# Patient Record
Sex: Male | Born: 1945 | Race: White | Hispanic: No | Smoking: Former smoker
Health system: Southern US, Community
[De-identification: ages and names within clinical notes are randomized; demographics above are authoritative.]

## PROBLEM LIST (undated history)

## (undated) DIAGNOSIS — Z973 Presence of spectacles and contact lenses: Secondary | ICD-10-CM

## (undated) DIAGNOSIS — Z85820 Personal history of malignant melanoma of skin: Secondary | ICD-10-CM

## (undated) DIAGNOSIS — N433 Hydrocele, unspecified: Secondary | ICD-10-CM

## (undated) DIAGNOSIS — K219 Gastro-esophageal reflux disease without esophagitis: Secondary | ICD-10-CM

## (undated) DIAGNOSIS — M1990 Unspecified osteoarthritis, unspecified site: Secondary | ICD-10-CM

## (undated) DIAGNOSIS — N5089 Other specified disorders of the male genital organs: Secondary | ICD-10-CM

## (undated) DIAGNOSIS — I10 Essential (primary) hypertension: Secondary | ICD-10-CM

## (undated) DIAGNOSIS — G4733 Obstructive sleep apnea (adult) (pediatric): Secondary | ICD-10-CM

## (undated) DIAGNOSIS — Z8719 Personal history of other diseases of the digestive system: Secondary | ICD-10-CM

## (undated) DIAGNOSIS — R32 Unspecified urinary incontinence: Secondary | ICD-10-CM

## (undated) DIAGNOSIS — F419 Anxiety disorder, unspecified: Secondary | ICD-10-CM

## (undated) DIAGNOSIS — H409 Unspecified glaucoma: Secondary | ICD-10-CM

## (undated) DIAGNOSIS — G4730 Sleep apnea, unspecified: Secondary | ICD-10-CM

## (undated) DIAGNOSIS — C61 Malignant neoplasm of prostate: Secondary | ICD-10-CM

## (undated) DIAGNOSIS — R112 Nausea with vomiting, unspecified: Secondary | ICD-10-CM

## (undated) DIAGNOSIS — Z8546 Personal history of malignant neoplasm of prostate: Secondary | ICD-10-CM

## (undated) DIAGNOSIS — E785 Hyperlipidemia, unspecified: Secondary | ICD-10-CM

## (undated) DIAGNOSIS — Z8679 Personal history of other diseases of the circulatory system: Secondary | ICD-10-CM

## (undated) DIAGNOSIS — Z9889 Other specified postprocedural states: Secondary | ICD-10-CM

## (undated) HISTORY — PX: VASECTOMY: SHX75

---

## 1998-05-26 ENCOUNTER — Ambulatory Visit (HOSPITAL_COMMUNITY): Admission: RE | Admit: 1998-05-26 | Discharge: 1998-05-26 | Payer: Self-pay | Admitting: Gastroenterology

## 2009-12-01 ENCOUNTER — Observation Stay (HOSPITAL_COMMUNITY): Admission: EM | Admit: 2009-12-01 | Discharge: 2009-12-01 | Payer: Self-pay | Admitting: Emergency Medicine

## 2009-12-01 ENCOUNTER — Encounter: Payer: Self-pay | Admitting: Cardiology

## 2009-12-01 ENCOUNTER — Ambulatory Visit: Payer: Self-pay | Admitting: Cardiology

## 2009-12-05 ENCOUNTER — Telehealth (INDEPENDENT_AMBULATORY_CARE_PROVIDER_SITE_OTHER): Payer: Self-pay | Admitting: *Deleted

## 2009-12-06 ENCOUNTER — Encounter (HOSPITAL_COMMUNITY): Admission: RE | Admit: 2009-12-06 | Discharge: 2010-02-23 | Payer: Self-pay | Admitting: Cardiology

## 2009-12-06 ENCOUNTER — Ambulatory Visit: Payer: Self-pay

## 2009-12-06 ENCOUNTER — Ambulatory Visit: Payer: Self-pay | Admitting: Cardiology

## 2009-12-15 DIAGNOSIS — E669 Obesity, unspecified: Secondary | ICD-10-CM

## 2009-12-15 DIAGNOSIS — I10 Essential (primary) hypertension: Secondary | ICD-10-CM | POA: Insufficient documentation

## 2009-12-15 DIAGNOSIS — I471 Supraventricular tachycardia: Secondary | ICD-10-CM

## 2009-12-15 DIAGNOSIS — R002 Palpitations: Secondary | ICD-10-CM

## 2009-12-19 ENCOUNTER — Ambulatory Visit: Payer: Self-pay | Admitting: Cardiology

## 2009-12-19 DIAGNOSIS — R9430 Abnormal result of cardiovascular function study, unspecified: Secondary | ICD-10-CM | POA: Insufficient documentation

## 2009-12-19 DIAGNOSIS — R079 Chest pain, unspecified: Secondary | ICD-10-CM

## 2009-12-20 ENCOUNTER — Encounter: Payer: Self-pay | Admitting: Cardiology

## 2009-12-22 ENCOUNTER — Ambulatory Visit: Payer: Self-pay | Admitting: Cardiology

## 2009-12-22 ENCOUNTER — Inpatient Hospital Stay (HOSPITAL_BASED_OUTPATIENT_CLINIC_OR_DEPARTMENT_OTHER): Admission: RE | Admit: 2009-12-22 | Discharge: 2009-12-22 | Payer: Self-pay | Admitting: Cardiology

## 2010-01-05 ENCOUNTER — Ambulatory Visit: Payer: Self-pay | Admitting: Cardiology

## 2010-12-16 LAB — CONVERTED CEMR LAB
BUN: 16 mg/dL (ref 6–23)
Basophils Absolute: 0 10*3/uL (ref 0.0–0.1)
Basophils Relative: 0.2 % (ref 0.0–3.0)
CO2: 30 meq/L (ref 19–32)
Calcium: 9.1 mg/dL (ref 8.4–10.5)
Chloride: 106 meq/L (ref 96–112)
Eosinophils Absolute: 0.3 10*3/uL (ref 0.0–0.7)
Eosinophils Relative: 3.4 % (ref 0.0–5.0)
Glucose, Bld: 96 mg/dL (ref 70–99)
INR: 1 (ref 0.8–1.0)
Lymphocytes Relative: 16.7 % (ref 12.0–46.0)
Lymphs Abs: 1.4 10*3/uL (ref 0.7–4.0)
MCHC: 33.9 g/dL (ref 30.0–36.0)
Monocytes Relative: 9.6 % (ref 3.0–12.0)
Neutro Abs: 5.9 10*3/uL (ref 1.4–7.7)
Neutrophils Relative %: 70.1 % (ref 43.0–77.0)
Potassium: 4.3 meq/L (ref 3.5–5.1)
Sodium: 141 meq/L (ref 135–145)

## 2010-12-17 IMAGING — CR DG CHEST 1V
2 series · 2 of 2 positions shown · non-contrast
Comparison: None

CLINICAL DATA: Chest pain.

CHEST - 1 VIEW

[w chest pa]
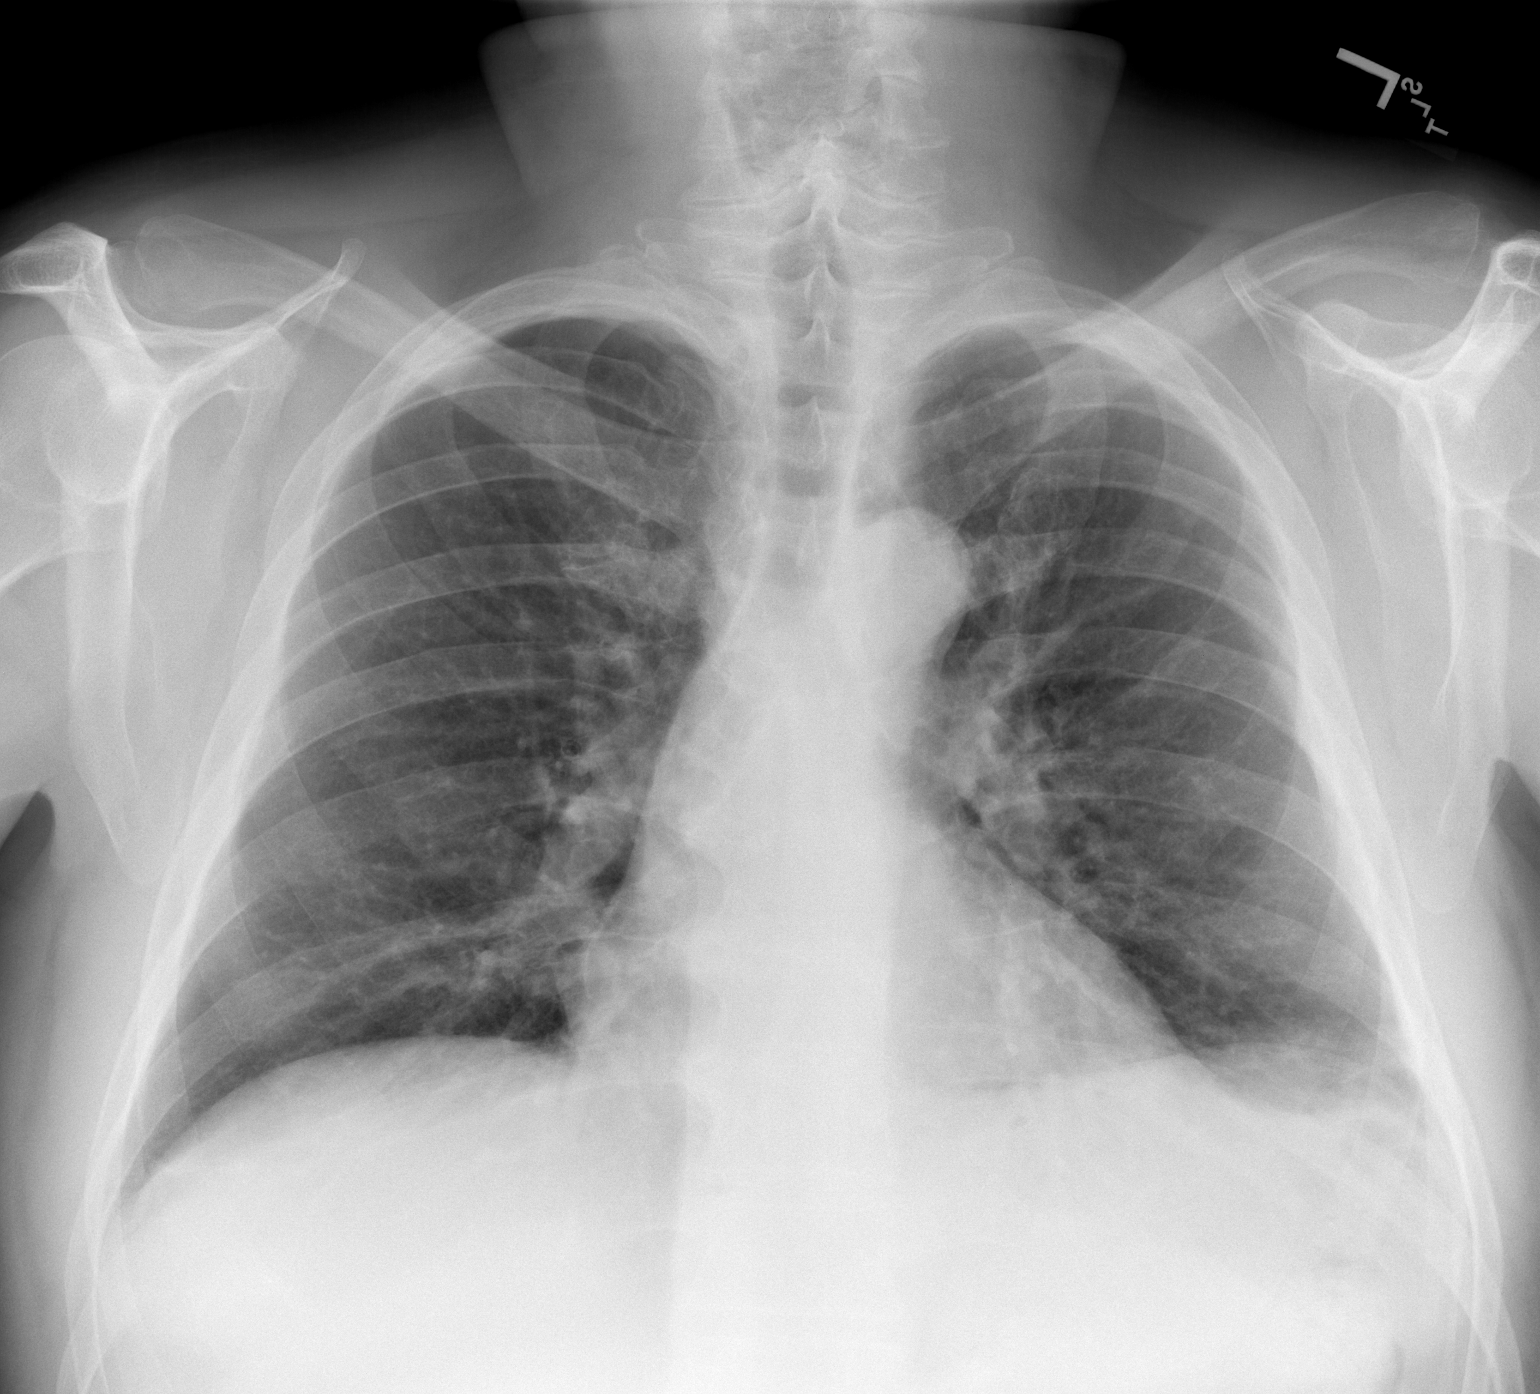

[w chest lat]
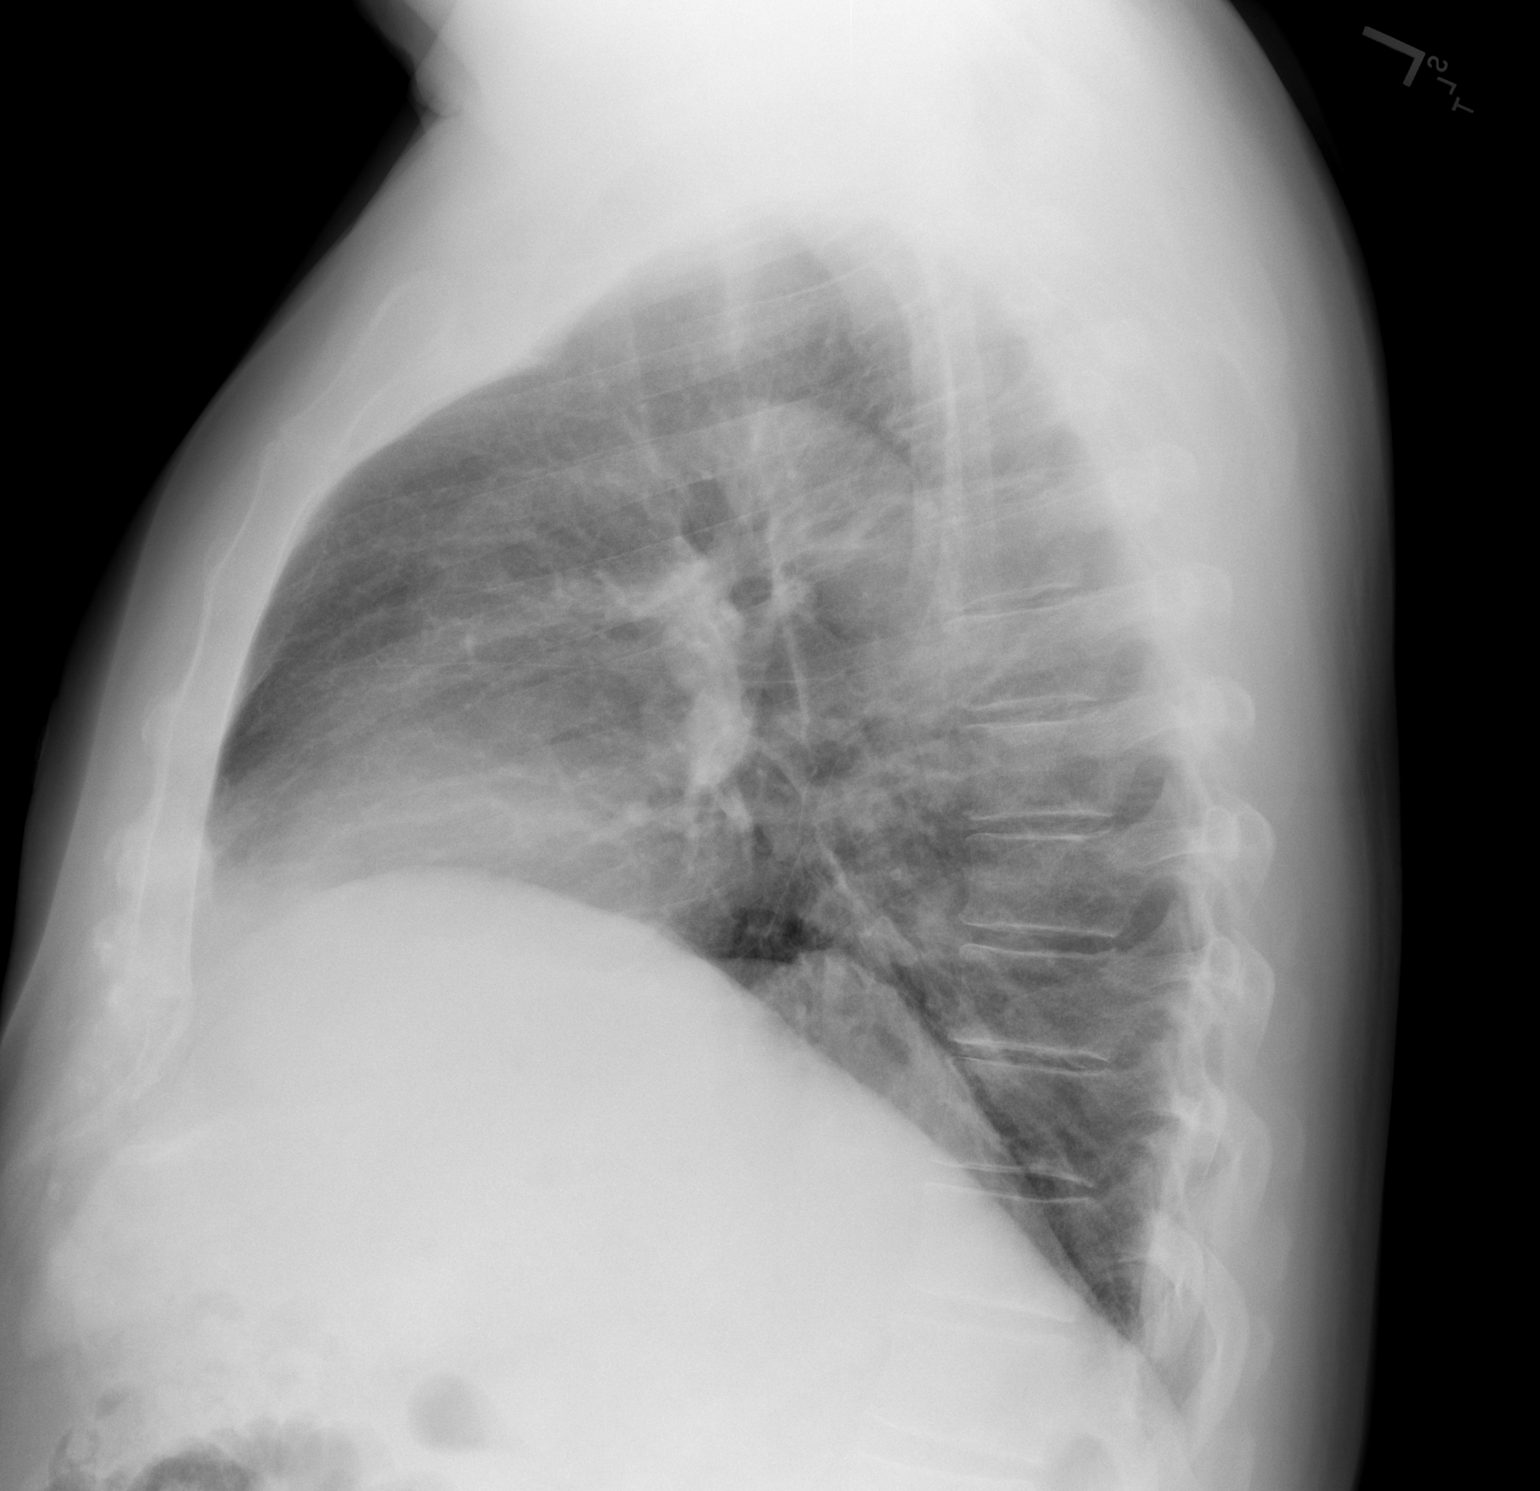

[2 of 2 positions shown; findings below may reference images not displayed]

FINDINGS: There is mild peribronchial thickening.  Bibasilar
atelectasis present.  No effusions.  Heart is upper limits normal
in size.
IMPRESSION: Bronchitic changes.  Bibasilar atelectasis.

## 2010-12-20 NOTE — Assessment & Plan Note (Signed)
Summary: EPH/ F/U ON STRESS TEST, HOLTER MONITOR/ GD   Visit Type:  Follow-up Primary Provider:  Dr Merlene Laughter  CC:  holter monitor-stress test follow up.  History of Present Illness: 65 yo with history of HTN and SVT presents to establish cardiology followup.  In early January, patient developed the sensation of his heart racing while he was sitting on the sofa watching a [employer] basketball game.  He had just had a heated discussion over the phone with his son.  He developed chest pressure.  As the symptoms did not resolve, he called EMS and was taken to the ER.  Heart rate was back to normal in the ER.  He was noted to have mildly elevated cardiac enzymes.  In the hospital, telemetry captured an asymptomatic run of long R-P tachycardia (atrial tachycardia vs AVNRT).  He was started on a beta blocker and discharged home.  He has been wearing a 3 week event monitor which so far has shown nothing significant.  He had an ETT-myoview given the chest pain and mildly elevated cardiac enzymes, which showed inferior ischemia.    He is back at work.  He has had no further chest pressure. He is active at work, going up and down ladders, etc.  No exertional dyspnea.    ECG: NSR, LAFB  Labs (1/11): TSH normal, CKMB 12.2=>9.6, TnI 0.07=>0.09, LDL 121, HDL 35  Current Medications (verified): 1)  Metoprolol Succinate 25 Mg Xr24h-Tab (Metoprolol Succinate) .... Take One Tablet By Mouth Daily 2)  Prinzide 20-12.5 Mg Tabs (Lisinopril-Hydrochlorothiazide) .[name].. 1 Tab Once Daily (Pt Will Start Combination Drug After He Finishes The Indiuvial Lisinopril and Hctz) (Hold) 3)  Aspirin 81 Mg Tbec (Aspirin) .... Take One Tablet By Mouth Daily 4)  Finasteride 5 Mg Tabs (Finasteride) .[name].. 1 Tab Once Daily 5)  Hydrochlorothiazide 12.5 Mg Tabs (Hydrochlorothiazide) .... Take One Tablet By Mouth Daily. 6)  Lisinopril 20 Mg Tabs (Lisinopril) .... Take One Tablet By Mouth Daily  Allergies (verified): No Known Drug  Allergies  Past History:  Past Medical History: 1. SVT: Admitted 1/11 after episode of racing heart rate and chest pain.  HR back to normal by the time he reached the ER but telemetry overnight captured a run of long R-P SVT, either atrial tachycardia or atypical AVNRT.   2. CAD: Elevated cardiac enzymes (mildly) when admitted with racing heart rate/SVT.  ETT-myoview (1/11) showed EF 56%, mild to moderate inferior ischemia.  Patient exercised 7'30", no chest pain.  3.  HTN  Family History: Father had MI at age 21.  Mother with CVA.    Social History: Lives in [place], married with a son.  Company secretary.  Never smoked.  Occasional ETOH.   Review of Systems       All systems reviewed and negative except as per HPI.   Vital Signs:  Patient profile:   65 year old male Height:      73 inches Weight:      239 pounds BMI:     31.65 Pulse rate:   71 / minute BP sitting:   118 / 77  (left arm) Cuff size:   regular  Vitals Entered By: Burnett Kanaris, CNA (December 19, 2009 11:26 AM)  Physical Exam  General:  Well developed, well nourished, in no acute distress. Head:  normocephalic and atraumatic Nose:  no deformity, discharge, inflammation, or lesions Mouth:  Teeth, gums and palate normal. Oral mucosa normal. Neck:  Neck supple, no JVD. No  masses, thyromegaly or abnormal cervical nodes. Lungs:  Clear bilaterally to auscultation and percussion. Heart:  Non-displaced PMI, chest non-tender; regular rate and rhythm, S1, S2 without murmurs, rubs or gallops. Carotid upstroke normal, no bruit. Pedals normal pulses. No edema, no varicosities. Abdomen:  Bowel sounds positive; abdomen soft and non-tender without masses, organomegaly, or hernias noted. No hepatosplenomegaly. Msk:  Back normal, normal gait. Muscle strength and tone normal. Extremities:  No clubbing or cyanosis. Neurologic:  Alert and oriented x 3. Skin:  Intact without lesions or rashes. Psych:  Normal  affect.   Impression & Recommendations:  Problem # 1:  CHEST PAIN (ICD-786.50) Patient had chest pain and elevated cardiac enzymes (mildly) in the setting of probable SVT.  This could have been demand ischemia due to tachycardia, but he had an ETT-myoview suggestive of inferior ischemia.  We discussed management options.  He is not symptomatic with his day to day activities, so I told him that medical management would be an option.  However, he does not want to start any additional medications if he does not have to so we have decided to do a left heart catheterization for definitive diagnosis of CAD.  He will continue ASA, ACEI, and beta blocker.    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  While in the hospital, he had a run of long R-P tachycardia that was atrial tachycardia versus atypical AVNRT.  He has had no further palpitations and nothing on his event monitor.  It appears to be suppressed by Toprol XL.  He has also stopped drinking caffeine which probably helps.    Other Orders: EKG w/ Interpretation (93000) Cardiac Catheterization (Cardiac Cath) TLB-BMP (Basic Metabolic Panel-BMET) (80048-METABOL) TLB-PT (Protime) (85610-PTP) TLB-CBC Platelet - w/Differential (85025-CBCD)  Patient Instructions: 1)  Your physician has requested that you have a cardiac catheterization.  Cardiac catheterization is used to diagnose and/or treat various heart conditions. Doctors may recommend this procedure for a number of different reasons. The most common reason is to evaluate chest pain. Chest pain can be a symptom of coronary artery disease (CAD), and cardiac catheterization can show whether plaque is narrowing or blocking your heart's arteries. This procedure is also used to evaluate the valves, as well as measure the blood flow and oxygen levels in different parts of your heart.  For further information please visit [email or web address]   Please follow instruction sheet, as given. 2)  Your physician recommends that you continue on your current medications as directed. Please refer to the Current Medication list given to you today.

## 2010-12-20 NOTE — Assessment & Plan Note (Signed)
Summary: eph/ gd   Primary Provider:  Dr Merlene Laughter  CC:  eph for flutter.  pt reports heart cath.  No symptoms at this time.  History of Present Illness: 65 yo with history of HTN and SVT presents for cardiology followup.  In early January, patient developed the sensation of his heart racing while he was sitting on the sofa watching a [employer] basketball game.  He had just had a heated discussion over the phone with his son.  He developed chest pressure.  As the symptoms did not resolve, he called EMS and was taken to the ER.  Heart rate was back to normal in the ER.  He was noted to have mildly elevated cardiac enzymes.  In the hospital, telemetry captured an asymptomatic run of long R-P tachycardia (atrial tachycardia vs atypical AVNRT).  He was started on a beta blocker and discharged home.  He had an ETT-myoview given the chest pain and mildly elevated cardiac enzymes, which showed inferior ischemia.  We therefore did a heart catheterization showing no angiographic CAD and normal LV systolic function.  3 week event monitor on Toprol XL showed no runs of SVT.   He is back at work.  He has had no further chest pressure. He is active at work, going up and down ladders, etc.  No exertional dyspnea.  Right groin site has healed well from cath.   ECG: NSR, iRBBB, LAFB  Labs (1/11): TSH normal, CKMB 12.2=>9.6, TnI 0.07=>0.09, LDL 121, HDL 35  Allergies: No Known Drug Allergies  Past History:  Past Medical History: 1. SVT: Admitted 1/11 after episode of racing heart rate and chest pain.  HR back to normal by the time he reached the ER but telemetry overnight captured a run of long R-P SVT, either atrial tachycardia or atypical AVNRT.  On Toprol XL, 3 week event monitor showed no arrhythmias.  2. CAD: Elevated cardiac enzymes (mildly) when admitted with racing heart rate/SVT.  ETT-myoview (1/11) showed EF 56%, mild to moderate inferior ischemia.  Patient exercised 7'30", no chest pain.  Left heart  cath (2/11) showed normal coronaries and EF 55%. Myoview was likely a false positive.  3.  HTN  Family History: Reviewed history from 12/19/2009 and no changes required. Father had MI at age 109.  Mother with CVA.    Social History: Reviewed history from 12/19/2009 and no changes required. 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  Occasional ETOH.   Vital Signs:  Patient profile:   65 year old male Height:      73 inches Weight:      238 pounds BMI:     31.51 Pulse rate:   97 / minute Pulse rhythm:   regular BP sitting:   118 / 84  (left arm) Cuff size:   large  Vitals Entered By: Judithe Modest CMA (January 05, 2010 3:42 PM)  Physical Exam  General:  Well developed, well nourished, in no acute distress. Neck:  Neck supple, no JVD. No masses, thyromegaly or abnormal cervical nodes. Lungs:  Clear bilaterally to auscultation and percussion. Heart:  Non-displaced PMI, chest non-tender; regular rate and rhythm, S1, S2 without murmurs, rubs or gallops. Carotid upstroke normal, no bruit. Pedals normal pulses. No edema, no varicosities. Abdomen:  Bowel sounds positive; abdomen soft and non-tender without masses, organomegaly, or hernias noted. No hepatosplenomegaly. Extremities:  No clubbing or cyanosis. Neurologic:  Alert and oriented x 3. Psych:  Normal affect.  Impression & Recommendations:  Problem # 1:  CARDIOVASCULAR FUNCTION STUDY, ABNORMAL (ICD-794.30) Left heart cath showed EF 55% and no angiographic coronary disease.  Myoview was likely a false positive from diaphragmatic attenuation.  The elevation in cardiac enzymes during hospitalization was likely due to demand ischemia from tachycardia.   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  No arrhythmic events on 3 week event monitor suggests that Toprol XL is suppressing arrhythmia.  Continue Toprol XL.    Followup in 1 year.   Other Orders: EKG w/  Interpretation (93000)  Patient Instructions: 1)  Your physician wants you to follow-up in: 1 year with Dr Marca Ancona.  You will receive a reminder letter in the mail two months in advance. If you don't receive a letter, please call our office to schedule the follow-up appointment. Prescriptions: METOPROLOL SUCCINATE 25 MG XR24H-TAB (METOPROLOL SUCCINATE) Take one tablet by mouth daily  #30 x 11   Entered by:   Katina Dung, RN, BSN   Authorized by:   Marca Ancona, MD   Signed by:   Katina Dung, RN, BSN on 01/05/2010   Method used:   Electronically to        [facility]  [address] (retail)       [address]       [place], [place]  [address]       Ph: [phone] or [phone]       Fax: [phone]   RxID:   [id number]

## 2010-12-20 NOTE — Progress Notes (Signed)
Summary: Nuclear Pre-Procedure  Phone Note Outgoing Call Call back at [facility] Phone [phone]   Call placed by: Stanton Kidney, EMT-P,  December 05, 2009 1:12 PM Action Taken: Phone Call Completed Summary of Call: Left message with information on Myoview Information Sheet (see scanned document for details).      Nuclear Med Background Indications for Stress Test: Evaluation for Ischemia, Post Hospital  Indications Comments: 12/01/09 [facility]: CP/Palps, (PSVT), MI R/O  History: Echo, GXT  History Comments:  ~20 yrs ago GXT: (-) 12/01/09 Echo: EF=50-55%  Symptoms: Chest Pain, Palpitations    Nuclear Pre-Procedure Cardiac Risk Factors: Family History - CAD, Hypertension

## 2010-12-20 NOTE — Consult Note (Signed)
Summary: [facility]   Imported By: Roderic Ovens 12/11/2009 12:18:52  _____________________________________________________________________  External Attachment:    Type:   Image     Comment:   External Document

## 2010-12-20 NOTE — Letter (Signed)
Summary: Cardiac Catheterization Instructions- [facility]  [facility], Main Office  [address]   [place], [place] [address]   Phone: [phone]  Fax: [phone]     12/19/2009 MRN: [id number]  [facility] [address] [place], [place]  [address]  Dear Mr. Hilgert,   You are scheduled for a Cardiac Catheterization on Friday February 4,2011 with Dr. Marca Ancona.  Please arrive to the 1st floor of the Heart and Vascular Center at [facility] at 10:30 am  on the day of your procedure. Please do not arrive before 6:30 a.m. Call the Heart and Vascular Center at [phone] if you are unable to make your appointmnet. The Code to get into the parking garage under the building is 0900. Take the elevators to the 1st floor. You must have someone to drive you home. Someone must be with you for the first 24 hours after you arrive home. Please wear clothes that are easy to get on and off and wear slip-on shoes. Do not eat or drink after midnight except water with your medications that morning. Bring all your medications and current insurance cards with you.  __x_ DO NOT take these medications before your procedure:   Hold your HCTZ (Hydrochlorothiazide) the morning of the procedure.  _x__ Make sure you take your aspirin.       The usual length of stay after your procedure is 2 to 3 hours. This can vary.  If you have any questions, please call the office at the number listed above.   Katina Dung, RN, BSN

## 2010-12-20 NOTE — Cardiovascular Report (Signed)
Summary: Pre Cath Orders  Pre Cath Orders   Imported By: Roderic Ovens 12/27/2009 12:29:24  _____________________________________________________________________  External Attachment:    Type:   Image     Comment:   External Document

## 2010-12-20 NOTE — Assessment & Plan Note (Signed)
Summary: Cardiology Nuclear Study  Nuclear Med Background Indications for Stress Test: Evaluation for Ischemia, Post Hospital  Indications Comments: 12/01/09 [facility]:chest pain, palpitations (PSVT), MI R/O  History: Echo, GXT  History Comments:  ~20 yrs ago [id number];  12/01/09 Echo: EF=50-55%  Symptoms: Chest Tightness, Nausea, Palpitations, Rapid HR  Symptoms Comments: Last episode of [name]   Nuclear Pre-Procedure Cardiac Risk Factors: Family History - CAD, History of Smoking, Hypertension, Obesity Caffeine/Decaff Intake: None NPO After: 3:00 PM Lungs: Clear IV 0.9% NS with Angio Cath: 22g     IV Site: (R) AC IV Started by: Irean Hong RN Chest Size (in) 44     Height (in): 73 Weight (lb): 239 BMI: 31.65 Tech Comments: Held Toprol x 24 hours  Nuclear Med Study 1 or 2 day study:  1 day     Stress Test Type:  Stress Reading MD:  Willa Rough, MD     Referring MD:  Marca Ancona, MD Resting Radionuclide:  Technetium 80m Tetrofosmin     Resting Radionuclide Dose:  10.0 mCi  Stress Radionuclide:  Technetium 44m Tetrofosmin     Stress Radionuclide Dose:  33.0 mCi   Stress Protocol Exercise Time (min):  7:30 min     Max HR:  141 bpm     Predicted Max HR:  155 bpm  Max Systolic BP: 203 mm Hg     Percent Max HR:  90.97 %     METS: 9.3 Rate Pressure Product:  47829    Stress Test Technologist:  Rea College CMA-N     Nuclear Technologist:  Burna Mortimer Deal RT-N  Rest Procedure  Myocardial perfusion imaging was performed at rest 45 minutes following the intravenous administration of Myoview Technetium 59m Tetrofosmin.  Stress Procedure  The patient exercised for 7:30.  The patient stopped due to fatigue and denied any chest pain.  There were no significant ST-T wave changes, only occasional PVC's and PAC's,  Intermittent RBBB was noted in recovery.  He did have a hypertensive response to exercise 203/89 and 200/97, while off his Toprol for 24 hours. Myoview was  injected at peak exercise and myocardial perfusion imaging was performed after a brief delay.  QPS Raw Data Images:  Patient motion noted; appropriate software correction applied. Stress Images:  Mild decrease in activity in the inferior wall. Rest Images:  Normal homogeneous uptake in all areas of the myocardium. Subtraction (SDS):  Mild reversibility in the inferior wall. Transient Ischemic Dilatation:  1.00  (Normal <1.22)  Lung/Heart Ratio:  .32  (Normal <0.45)  Quantitative Gated Spect Images QGS EDV:  96 ml QGS ESV:  42 ml QGS EF:  56 % QGS cine images:  Normal motion.   Overall Impression  Exercise Capacity: Fair exercise capacity. BP Response: Hypertensive blood pressure response. Clinical Symptoms: No chest pain ECG Impression: No significant ST segment change suggestive of ischemia. Overall Impression Comments: Visually there is very mild reversibility in the inferior wall. The quantitative analysis suggests mild/moderate ischemia in the inferior wall.  Appended Document: Cardiology Nuclear Study Possible mild inferior ischemia.  Needs to followup in the office to talk about this.   Appended Document: Cardiology Nuclear Study Pt aware of results. He has an appt to f/u on 12/19/09 with Dr. [name].

## 2011-02-03 LAB — LIPID PANEL
HDL: 35 mg/dL — ABNORMAL LOW
Total CHOL/HDL Ratio: 5 ratio
Triglycerides: 94 mg/dL
VLDL: 19 mg/dL (ref 0–40)

## 2011-02-03 LAB — POCT CARDIAC MARKERS
CKMB, poc: 4.8 ng/mL (ref 1.0–8.0)
CKMB, poc: 9 ng/mL (ref 1.0–8.0)
Myoglobin, poc: 115 ng/mL (ref 12–200)
Myoglobin, poc: 250 ng/mL (ref 12–200)
Troponin i, poc: <0.05 ng/mL (ref 0.00–0.09)
Troponin i, poc: <0.05 ng/mL (ref 0.00–0.09)

## 2011-02-03 LAB — CARDIAC PANEL(CRET KIN+CKTOT+MB+TROPI)
CK, MB: 12.2 ng/mL (ref 0.3–4.0)
CK, MB: 9.6 ng/mL (ref 0.3–4.0)
Relative Index: 3.4 — ABNORMAL HIGH (ref 0.0–2.5)
Relative Index: 3.5 — ABNORMAL HIGH (ref 0.0–2.5)
Total CK: 276 U/L — ABNORMAL HIGH (ref 7–232)
Total CK: 362 U/L — ABNORMAL HIGH (ref 7–232)

## 2011-02-03 LAB — DIFFERENTIAL
Basophils Absolute: 0.1 10*3/uL (ref 0.0–0.1)
Basophils Relative: 1 % (ref 0–1)
Eosinophils Absolute: 0.3 10*3/uL (ref 0.0–0.7)
Eosinophils Relative: 4 % (ref 0–5)
Lymphocytes Relative: 16 % (ref 12–46)
Monocytes Relative: 12 % (ref 3–12)
Neutrophils Relative %: 67 % (ref 43–77)

## 2011-02-03 LAB — CK TOTAL AND CKMB (NOT AT ARMC)
CK, MB: 16.9 ng/mL (ref 0.3–4.0)
Relative Index: 3 — ABNORMAL HIGH (ref 0.0–2.5)
Total CK: 556 U/L — ABNORMAL HIGH (ref 7–232)

## 2011-02-03 LAB — BASIC METABOLIC PANEL WITH GFR
BUN: 26 mg/dL — ABNORMAL HIGH (ref 6–23)
CO2: 27 meq/L (ref 19–32)
Calcium: 8.5 mg/dL (ref 8.4–10.5)
Chloride: 106 meq/L (ref 96–112)
Creatinine, Ser: 1.2 mg/dL (ref 0.4–1.5)
GFR calc non Af Amer: >60 mL/min
Glucose, Bld: 99 mg/dL (ref 70–99)
Potassium: 3.8 meq/L (ref 3.5–5.1)
Sodium: 140 meq/L (ref 135–145)

## 2011-02-03 LAB — CBC
Hemoglobin: 14.8 g/dL (ref 13.0–17.0)
MCHC: 34.8 g/dL (ref 30.0–36.0)
MCV: 90.5 fL (ref 78.0–100.0)
RBC: 4.69 MIL/uL (ref 4.22–5.81)
WBC: 7.6 10*3/uL (ref 4.0–10.5)

## 2011-02-03 LAB — TROPONIN I: Troponin I: 0.07 ng/mL — ABNORMAL HIGH (ref 0.00–0.06)

## 2011-02-03 LAB — TSH: TSH: 2.478 u[IU]/mL (ref 0.350–4.500)

## 2011-02-03 LAB — APTT: aPTT: 31 s (ref 24–37)

## 2011-02-03 LAB — PROTIME-INR: INR: 0.94 (ref 0.00–1.49)

## 2011-02-03 LAB — D-DIMER, QUANTITATIVE: D-Dimer, Quant: 0.33 ug/mL-FEU (ref 0.00–0.48)

## 2011-10-02 ENCOUNTER — Other Ambulatory Visit: Payer: Self-pay | Admitting: Geriatric Medicine

## 2011-10-02 ENCOUNTER — Ambulatory Visit
Admission: RE | Admit: 2011-10-02 | Discharge: 2011-10-02 | Disposition: A | Discharge status: Home / Self Care | Payer: Medicare Other | Source: Ambulatory Visit | Attending: Geriatric Medicine | Admitting: Geriatric Medicine

## 2011-10-02 MED ORDER — IOHEXOL 300 MG/ML  SOLN
75.0000 mL | Freq: Once | INTRAMUSCULAR | Status: AC | PRN
  Administered 2011-10-02: 75 mL via INTRAVENOUS

## 2014-08-24 HISTORY — PX: PROSTATE BIOPSY: SHX241

## 2014-09-22 ENCOUNTER — Other Ambulatory Visit: Payer: Self-pay | Admitting: Urology

## 2014-09-22 DIAGNOSIS — C61 Malignant neoplasm of prostate: Secondary | ICD-10-CM

## 2014-10-07 ENCOUNTER — Ambulatory Visit (HOSPITAL_COMMUNITY): Payer: Medicare Other

## 2014-10-07 ENCOUNTER — Encounter (HOSPITAL_COMMUNITY): Payer: Medicare Other

## 2014-10-10 ENCOUNTER — Encounter (HOSPITAL_COMMUNITY)
Admission: RE | Admit: 2014-10-10 | Discharge: 2014-10-10 | Disposition: A | Discharge status: Home / Self Care | Payer: Medicare Other | Source: Ambulatory Visit | Attending: Urology | Admitting: Urology

## 2014-10-10 DIAGNOSIS — C61 Malignant neoplasm of prostate: Secondary | ICD-10-CM | POA: Insufficient documentation

## 2014-10-10 MED ORDER — TECHNETIUM TC 99M MEDRONATE IV KIT
26.2000 | PACK | Freq: Once | INTRAVENOUS | Status: AC | PRN
  Administered 2014-10-10: 26.2 via INTRAVENOUS

## 2014-10-11 ENCOUNTER — Encounter: Payer: Self-pay | Admitting: Radiation Oncology

## 2014-10-11 NOTE — Progress Notes (Signed)
GU Location of Tumor / Histology: prostate cancer  If Prostate Cancer, Gleason Score is (3+3=6, 4+4=8) and PSA is (5.18) and prostate volume is 38 mL   Abbott Pao presented with a rising PSA.  Biopsies revealed:   08/24/14   Past/Anticipated interventions by urology, if any: prostate biopsy, surgery versus radiation and 2 years androgen ablation.  Past/Anticipated interventions by medical oncology, if any: no  Weight changes, if any: no  Bowel/Bladder complaints, if any: no IPSS score of 3  Nausea/Vomiting, if any: no  Pain issues, if any:  no  SAFETY ISSUES:  Prior radiation? no  Pacemaker/ICD? no  Possible current pregnancy? no  Is the patient on methotrexate? no  Current Complaints / other details:  Patient is here with his significant other.  He has two children.

## 2014-10-18 ENCOUNTER — Other Ambulatory Visit: Payer: Self-pay | Admitting: Urology

## 2014-10-18 DIAGNOSIS — R948 Abnormal results of function studies of other organs and systems: Secondary | ICD-10-CM

## 2014-10-19 ENCOUNTER — Encounter: Payer: Self-pay | Admitting: Radiation Oncology

## 2014-10-19 ENCOUNTER — Ambulatory Visit
Admission: RE | Admit: 2014-10-19 | Discharge: 2014-10-19 | Disposition: A | Discharge status: Home / Self Care | Payer: Medicare Other | Source: Ambulatory Visit | Attending: Radiation Oncology | Admitting: Radiation Oncology

## 2014-10-19 VITALS — BP 138/89 | HR 67 | Temp 98.2°F | Resp 16 | Ht 72.0 in | Wt 261.1 lb

## 2014-10-19 DIAGNOSIS — C61 Malignant neoplasm of prostate: Secondary | ICD-10-CM

## 2014-10-19 HISTORY — DX: Sleep apnea, unspecified: G47.30

## 2014-10-19 HISTORY — DX: Gastro-esophageal reflux disease without esophagitis: K21.9

## 2014-10-19 HISTORY — DX: Hyperlipidemia, unspecified: E78.5

## 2014-10-19 HISTORY — DX: Malignant neoplasm of prostate: C61

## 2014-10-19 HISTORY — DX: Unspecified glaucoma: H40.9

## 2014-10-19 NOTE — Progress Notes (Signed)
Radiation Oncology         ([phone]) [phone] ________________________________  Initial Outpatient Consultation  Name: Anthony Glenn MRN: [id number]  Date: 10/19/2014  DOB: 1946/09/04  OA:[id number],SWF Marcello Moores, MD  Malka So, MD   REFERRING PHYSICIAN: Malka So, MD  DIAGNOSIS: stage TIc Gleason's 8 (4+4)  prostate cancer, PSA 5.18  HISTORY OF PRESENT ILLNESS::Anthony Glenn is a 68 y.o. male who is seen out courtesy of Dr. Irine Seal for an opinion concerning radiation therapy as part of management of patient's recently diagnosed prostate cancer. The patient has been undergoing yearly PSAs. The patient's most recent PSA was elevated at 5.18. Patient was referred to Dr. Jeffie Pollock for evaluation. no palpable nodules within the prostate gland. This proceeded to undergo transrectal ultrasound and biopsy. The prostate volume was 39.7 cm. There were no lesions noted on ultrasound. 4/12 biopsies revealed adenocarcinoma with the most significant involvement in the left lateral mid prostate gland showing 30% involvement of one core with a Gleason score of 8.  The patient underwent a bone scan showing no obvious osseous metastasis. The patient's CT scan of abdomen and pelvis is pending at this time. Assuming no evidence of metastasis on CT scan patient felt to be a good candidate for radical prostatectomy given his age and excellent health. Radiation therapy is been consulted for further discussion of the patient's options concerning management.[name]  PREVIOUS RADIATION THERAPY: No  PAST MEDICAL HISTORY:  has a past medical history of Prostate cancer; GERD (gastroesophageal reflux disease); Glaucoma; Hyperlipidemia; and Sleep apnea.    PAST SURGICAL HISTORY: Past Surgical History  Procedure Laterality Date  . Prostate biopsy  08/24/14  . Vasectomy      FAMILY HISTORY: family history includes Cancer in his father. prostate cancer at age approximately 68  SOCIAL HISTORY:  reports that he quit smoking  about 45 years ago. His smoking use included Cigarettes. He has a 1.5 pack-year smoking history. He has never used smokeless tobacco. He reports that he uses illicit drugs about twice per week. He reports that he does not drink alcohol.  ALLERGIES: Review of patient's allergies indicates no known allergies.  MEDICATIONS:  Current Outpatient Prescriptions  Medication Sig Dispense Refill  . latanoprost (XALATAN) 0.005 % ophthalmic solution 1 drop at bedtime.    [name] lisinopril-hydrochlorothiazide (PRINZIDE,ZESTORETIC) 20-12.5 MG per tablet Take 1 tablet by mouth daily.    . metoprolol succinate (TOPROL-XL) 50 MG 24 hr tablet Take 50 mg by mouth daily. Take with or immediately following a meal.    . timolol (TIMOPTIC) 0.5 % ophthalmic solution 1 drop 2 (two) times daily.     No current facility-administered medications for this encounter.    REVIEW OF SYSTEMS:  A 15 point review of systems is documented in the electronic medical record. This was obtained by the nursing staff. However, I reviewed this with the patient to discuss relevant findings and make appropriate changes.  The patient completed the international prostate symptom score showing total score of 3 representing minimal symptomatology.  the patient completed the SHIM Questionnaire with total score of 11 representing moderate erectile dysfunction.   PHYSICAL EXAM:  height is 6' (1.829 m) and weight is 261 lb 1.6 oz (118.434 kg). His oral temperature is 98.2 F (36.8 C). His blood pressure is 138/89 and his pulse is 67. His respiration is 16.   BP 138/89 mmHg  Pulse 67  Temp(Src) 98.2 F (36.8 C) (Oral)  Resp 16  Ht 6' (1.829 m)  Wt 261 lb 1.6 oz (118.434 kg)  BMI 35.40 kg/m2  General Appearance:    Alert, cooperative, no distress, appears stated age  Head:    Normocephalic, without obvious abnormality, atraumatic  Eyes:    PERRL, conjunctiva/corneas clear, EOM's intact,         Nose:   Nares normal, septum midline, mucosa  normal, no drainage    or sinus tenderness  Throat:   Lips, mucosa, and tongue normal; teeth and gums normal  Neck:   Supple, symmetrical, trachea midline, no adenopathy;       thyroid:  No enlargement/tenderness/nodules; no carotid   bruit or JVD  Back:     Symmetric, no curvature, ROM normal, no CVA tenderness  Lungs:     Clear to auscultation bilaterally, respirations unlabored  Chest wall:    No tenderness or deformity  Heart:    Regular rate and rhythm, S1 and S2 normal, no murmur, rub   or gallop  Abdomen:     Soft, non-tender, bowel sounds active all four quadrants,    no masses, no organomegaly  Genitalia:    Normal male without lesion, discharge or tenderness,  Uncircumcised, fluid noted in the scrotal sac  Rectal:    Normal tone, normal prostate, no masses or tenderness;    Extremities:   Extremities normal, atraumatic, no cyanosis or edema  Pulses:   2+ and symmetric all extremities  Skin:   Skin color, texture, turgor normal, no rashes or lesions  Lymph nodes:   Cervical, supraclavicular, and axillary nodes normal  Neurologic:    Normal strength, sensation and reflexes      throughout     ECOG = 0  0 - Asymptomatic (Fully active, able to carry on all predisease activities without restriction)  LABORATORY DATA:  Lab Results  Component Value Date   WBC 8.4 12/19/2009   HGB 15.5 12/19/2009   HCT 45.8 12/19/2009   MCV 91.3 12/19/2009   PLT 312.0 12/19/2009   NEUTROABS 5.9 12/19/2009   Lab Results  Component Value Date   NA 141 12/19/2009   K 4.3 12/19/2009   CL 106 12/19/2009   CO2 30 12/19/2009   GLUCOSE 96 12/19/2009   CREATININE 1.0 12/19/2009   CALCIUM 9.1 12/19/2009      RADIOGRAPHY: Nm Bone Scan Whole Body  10/10/2014   CLINICAL DATA:  Prostate cancer.  EXAM: NUCLEAR MEDICINE WHOLE BODY BONE SCAN  TECHNIQUE: Whole body anterior and posterior images were obtained approximately 3 hours after intravenous injection of radiopharmaceutical.   RADIOPHARMACEUTICALS:  26.2 mCi Technetium-99 MDP  COMPARISON:  No prior.  FINDINGS: Bilateral renal function and excretion. Mild increased activity noted over both shoulders, the medial joint spaces of both knees, and right foot consistent with degenerative change. Mild increased activity noted over the ethmoidal and right maxillary regions, these changes may be related to sinus disease. Mild increase activity noted over the right cervical thoracic vertebral region. Mild increased activity also noted over the mid and lower lumbar spine. Metastatic disease cannot be excluded. MRI lumbar spine with gadolinium enhancement suggested.  IMPRESSION: 1. Mild increased activity noted over the right cervicothoracic spine region (C7/T1 region). Mild focal areas of increased activity noted over the left mid and lower lumbar spine. Metastatic disease cannot be excluded. Gadolinium-enhanced MRI lumbar spine should be considered.  2. Mild increase activity noted over the ethmoidal regions and right maxillary region. These changes may be related to sinus disease. Sinus series can be obtained for further evaluation.  3. Mild increased activity noted both shoulders, the medial joint spaces of both knees, and right foot consistent with degenerative change.   Electronically Signed   By: Marcello Moores  Register   On: 10/10/2014 13:32      IMPRESSION: stage TIc Gleason's 8 (4+4)  prostate cancer, PSA 5.18.  I discussed with the patient and his friend that his prognosis relates to his presenting PSA, Gleason score and clinical stage. The patient's PSA and clinical stage are favorable however his Gleason score is worrisome. With his Gleason score of 8 and relatively young age I would not recommend watchful waiting in this situation.  Patient appears in excellent health and he would be at candidate for radical prostatectomy. I discussed this treatment approach as it compares to external beam radiation therapy and combination therapy of 5 weeks  of external beam and radioactive seed boost. I would recommend androgen ablation as part of his overall management if he chooses to proceed with radiation therapy given his Gleason score of 8. At This time the patient seems to be leaning toward surgery for definitive management.  PLAN:the patient will proceed with his CT scan of abdomen and pelvis and followup with Dr. Jeffie Pollock. I've asked patient to call me if he chooses to proceed with radiation therapy for definitive management.   I spent 60 minutes minutes face to face with the patient and more than 50% of that time was spent in counseling and/or coordination of care.   ------------------------------------------------  Blair Promise, PhD, MD

## 2014-10-19 NOTE — Progress Notes (Signed)
Please see the Nurse Progress Note in the MD Initial Consult Encounter for this patient. 

## 2014-11-02 ENCOUNTER — Ambulatory Visit (HOSPITAL_COMMUNITY)
Admission: RE | Admit: 2014-11-02 | Discharge: 2014-11-02 | Disposition: A | Discharge status: Home / Self Care | Payer: Medicare Other | Source: Ambulatory Visit | Attending: Urology | Admitting: Urology

## 2014-11-02 DIAGNOSIS — C61 Malignant neoplasm of prostate: Secondary | ICD-10-CM | POA: Diagnosis not present

## 2014-11-02 DIAGNOSIS — M5136 Other intervertebral disc degeneration, lumbar region: Secondary | ICD-10-CM | POA: Insufficient documentation

## 2014-11-02 DIAGNOSIS — R972 Elevated prostate specific antigen [PSA]: Secondary | ICD-10-CM | POA: Diagnosis not present

## 2014-11-02 DIAGNOSIS — M5137 Other intervertebral disc degeneration, lumbosacral region: Secondary | ICD-10-CM | POA: Insufficient documentation

## 2014-11-02 DIAGNOSIS — R948 Abnormal results of function studies of other organs and systems: Secondary | ICD-10-CM | POA: Diagnosis not present

## 2014-11-02 LAB — POCT I-STAT CREATININE: Creatinine, Ser: 1.3 mg/dL (ref 0.50–1.35)

## 2014-11-02 MED ORDER — GADOBENATE DIMEGLUMINE 529 MG/ML IV SOLN
20.0000 mL | Freq: Once | INTRAVENOUS | Status: AC | PRN
  Administered 2014-11-02: 20 mL via INTRAVENOUS

## 2014-11-25 ENCOUNTER — Other Ambulatory Visit: Payer: Self-pay | Admitting: Urology

## 2014-12-07 NOTE — Patient Instructions (Addendum)
Anthony Glenn  12/07/2014   Your procedure is scheduled on: 12/21/2014    Report to [facility] Main  Entrance and follow signs to               [place] at       [place].  Call this number if you have problems the morning of surgery [phone]   Remember:  Do not eat food or drink liquids :After Midnight.     Take these medicines the morning of surgery with A SIP OF WATER: Toprol, Timolol eye drops, Prilosec                                You may not have any metal on your body including hair pins and              piercings  Do not wear jewelry, , lotions, powders or perfumes.                       Men may shave face and neck.   Do not bring valuables to the hospital. [place].  Contacts, dentures or bridgework may not be worn into surgery.  Leave suitcase in the car. After surgery it may be brought to your room.       Special Instructions: coughing and deep breathing exercises, leg exercises               Please read over the following fact sheets you were given: _____________________________________________________________________              WHAT IS A BLOOD TRANSFUSION? Blood Transfusion Information  A transfusion is the replacement of blood or some of its parts. Blood is made up of multiple cells which provide different functions.  Red blood cells carry oxygen and are used for blood loss replacement.  White blood cells fight against infection.  Platelets control bleeding.  Plasma helps clot blood.  Other blood products are available for specialized needs, such as hemophilia or other clotting disorders. BEFORE THE TRANSFUSION  Who gives blood for transfusions?   Healthy volunteers who are fully evaluated to make sure their blood is safe. This is blood bank blood. Transfusion therapy is the safest it has ever been in the practice of medicine. Before blood is taken from a donor,  a complete history is taken to make sure that person has no history of diseases nor engages in risky social behavior (examples are intravenous drug use or sexual activity with multiple partners). The donor's travel history is screened to minimize risk of transmitting infections, such as malaria. The donated blood is tested for signs of infectious diseases, such as HIV and hepatitis. The blood is then tested to be sure it is compatible with you in order to minimize the chance of a transfusion reaction. If you or a relative donates blood, this is often done in anticipation of surgery and is not appropriate for emergency situations. It takes many days to process the donated blood. RISKS AND COMPLICATIONS Although transfusion therapy is very safe and saves many lives, the main dangers of transfusion include:   Getting an infectious disease.  Developing a transfusion reaction. This is an allergic reaction to something in  the blood you were given. Every precaution is taken to prevent this. The decision to have a blood transfusion has been considered carefully by your caregiver before blood is given. Blood is not given unless the benefits outweigh the risks. AFTER THE TRANSFUSION  Right after receiving a blood transfusion, you will usually feel much better and more energetic. This is especially true if your red blood cells have gotten low (anemic). The transfusion raises the level of the red blood cells which carry oxygen, and this usually causes an energy increase.  The nurse administering the transfusion will monitor you carefully for complications. HOME CARE INSTRUCTIONS  No special instructions are needed after a transfusion. You may find your energy is better. Speak with your caregiver about any limitations on activity for underlying diseases you may have. SEEK MEDICAL CARE IF:   Your condition is not improving after your transfusion.  You develop redness or irritation at the intravenous (IV)  site. SEEK IMMEDIATE MEDICAL CARE IF:  Any of the following symptoms occur over the next 12 hours:  Shaking chills.  You have a temperature by mouth above 102 F (38.9 C), not controlled by medicine.  Chest, back, or muscle pain.  People around you feel you are not acting correctly or are confused.  Shortness of breath or difficulty breathing.  Dizziness and fainting.  You get a rash or develop hives.  You have a decrease in urine output.  Your urine turns a dark color or changes to pink, red, or brown. Any of the following symptoms occur over the next 10 days:  You have a temperature by mouth above 102 F (38.9 C), not controlled by medicine.  Shortness of breath.  Weakness after normal activity.  The white part of the eye turns yellow (jaundice).  You have a decrease in the amount of urine or are urinating less often.  Your urine turns a dark color or changes to pink, red, or brown. Document Released: 11/01/2000 Document Revised: 01/27/2012 Document Reviewed: 06/20/2008 ExitCare Patient Information 2014 [employer].  _______________________________________________________________________[facility] - Preparing for Surgery Before surgery, you can play an important role.  Because skin is not sterile, your skin needs to be as free of germs as possible.  You can reduce the number of germs on your skin by washing with CHG (chlorahexidine gluconate) soap before surgery.  CHG is an antiseptic cleaner which kills germs and bonds with the skin to continue killing germs even after washing. Please DO NOT use if you have an allergy to CHG or antibacterial soaps.  If your skin becomes reddened/irritated stop using the CHG and inform your nurse when you arrive at Short Stay. Do not shave (including legs and underarms) for at least 48 hours prior to the first CHG shower.  You may shave your face/neck. Please follow these instructions carefully:  1.  Shower with CHG Soap the night  before surgery and the  morning of Surgery.  2.  If you choose to wash your hair, wash your hair first as usual with your  normal  shampoo.  3.  After you shampoo, rinse your hair and body thoroughly to remove the  shampoo.                           4.  Use CHG as you would any other liquid soap.  You can apply chg directly  to the skin and wash  Gently with a scrungie or clean washcloth.  5.  Apply the CHG Soap to your body ONLY FROM THE NECK DOWN.   Do not use on face/ open                           Wound or open sores. Avoid contact with eyes, ears mouth and genitals (private parts).                       Wash face,  Genitals (private parts) with your normal soap.             6.  Wash thoroughly, paying special attention to the area where your surgery  will be performed.  7.  Thoroughly rinse your body with warm water from the neck down.  8.  DO NOT shower/wash with your normal soap after using and rinsing off  the CHG Soap.                9.  Pat yourself dry with a clean towel.            10.  Wear clean pajamas.            11.  Place clean sheets on your bed the night of your first shower and do not  sleep with pets. Day of Surgery : Do not apply any lotions/deodorants the morning of surgery.  Please wear clean clothes to the hospital/surgery center.  FAILURE TO FOLLOW THESE INSTRUCTIONS MAY RESULT IN THE CANCELLATION OF YOUR SURGERY PATIENT SIGNATURE_________________________________  NURSE SIGNATURE__________________________________  ________________________________________________________________________

## 2014-12-08 ENCOUNTER — Ambulatory Visit (HOSPITAL_COMMUNITY)
Admission: RE | Admit: 2014-12-08 | Discharge: 2014-12-08 | Disposition: A | Discharge status: Home / Self Care | Payer: Medicare Other | Source: Ambulatory Visit | Attending: Urology | Admitting: Urology

## 2014-12-08 ENCOUNTER — Encounter (HOSPITAL_COMMUNITY)
Admission: RE | Admit: 2014-12-08 | Discharge: 2014-12-08 | Disposition: A | Discharge status: Home / Self Care | Payer: Medicare Other | Source: Ambulatory Visit | Attending: Urology | Admitting: Urology

## 2014-12-08 ENCOUNTER — Encounter (HOSPITAL_COMMUNITY): Payer: Self-pay

## 2014-12-08 DIAGNOSIS — Z01818 Encounter for other preprocedural examination: Secondary | ICD-10-CM

## 2014-12-08 HISTORY — DX: Essential (primary) hypertension: I10

## 2014-12-08 HISTORY — DX: Unspecified osteoarthritis, unspecified site: M19.90

## 2014-12-08 LAB — CBC
HCT: 46.5 % (ref 39.0–52.0)
Hemoglobin: 15.6 g/dL (ref 13.0–17.0)
MCH: 30.6 pg (ref 26.0–34.0)
MCHC: 33.5 g/dL (ref 30.0–36.0)
MCV: 91.2 fL (ref 78.0–100.0)
Platelets: 283 10*3/uL (ref 150–400)
RBC: 5.1 MIL/uL (ref 4.22–5.81)
RDW: 12.8 % (ref 11.5–15.5)
WBC: 8.3 10*3/uL (ref 4.0–10.5)

## 2014-12-08 LAB — BASIC METABOLIC PANEL
Anion gap: 5 (ref 5–15)
BUN: 22 mg/dL (ref 6–23)
CHLORIDE: 102 meq/L (ref 96–112)
CO2: 27 mmol/L (ref 19–32)
Calcium: 8.9 mg/dL (ref 8.4–10.5)
Creatinine, Ser: 1.07 mg/dL (ref 0.50–1.35)
GFR calc non Af Amer: 68 mL/min — ABNORMAL LOW (ref >90)
GFR, EST AFRICAN AMERICAN: 79 mL/min — AB (ref >90)
Glucose, Bld: 107 mg/dL — ABNORMAL HIGH (ref 70–99)
Potassium: 4.4 mmol/L (ref 3.5–5.1)
SODIUM: 134 mmol/L — AB (ref 135–145)

## 2014-12-08 NOTE — Progress Notes (Addendum)
EKG- 05/09/2014 on chart  Sleep Study - 05/2013 on chart   LOV with Cardiology - Dr Aundra Dubin- 01/05/2010- had Stress Test in 2011 due to SVT presentation in ER 11/2009 and cath which per office visit note in EPIC showed no angiographic CAD.

## 2014-12-09 ENCOUNTER — Other Ambulatory Visit (HOSPITAL_COMMUNITY): Payer: Self-pay

## 2014-12-20 NOTE — H&P (Signed)
Active Problems Problems  1. Elevated prostate specific antigen (PSA) (R97.2) 2. 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 Prostate cancer (C61)  History of Present Illness Anthony Glenn returns today for a preop visit for a RALP with BPLND on 12/21/14. He was biopsied for an elevated PSA of 5.18 and was found to have a T1c Nx Mx Gleason 8 high risk prostate cancer. His prostate volume is 6ml.  He had Gleason 8(4+4) in 30% of the left mid lateral core and had Gleason 6(3+3) in 15-20% of the right base and mid lateral cores and the left apical medial core.  He has a CAPRA score of 4 with a 42% chance of PSM and ECE, a 13% chance of SVI and 2.9% chance on LNI. He has an IPSS of 4 and a SHIM of 9.  He has minimal comorbidities or voiding complaints.   Past Medical History Problems  1. History of esophageal reflux (Z87.19) 2. History of glaucoma (Z86.69) 3. History of hyperlipidemia (Z86.39) 4. History of sleep apnea (Z87.09)  Surgical History Problems  1. History of Surgery Of Male Genitalia Vasectomy  Current Meds 1. Latanoprost 0.005 % Ophthalmic Solution;  Therapy: (Recorded:27Jul2015) to Recorded 2. Lisinopril-Hydrochlorothiazide 20-12.5 MG Oral Tablet;  Therapy: (Recorded:27Jul2015) to Recorded 3. Metoprolol Succinate ER 50 MG Oral Tablet Extended Release 24 Hour;  Therapy: (Recorded:27Jul2015) to Recorded 4. Timoptic SOLN;  Therapy: (Recorded:27Jul2015) to Recorded  Allergies Medication  1. No Known Drug Allergies  Family History Problems  1. Family history of Death of family member : Mother, Father   Mother at age 23; Heart attackFather at age 62; Stroke 2. Family history of myocardial infarction (Z82.49) : Father 3. Family history of stroke (Z82.3) : Mother  Social History Problems  1. Alcohol use (F10.99)   2 a week 2. Caffeine use (F15.90)   2 per day 3. Former smoker [phone])   1 ppd for 1 year and quit 45  years ago 58. Number of children   1 son and 1 daughter 5. Occupation   Scientist, water quality 6. Widowed  Review of Systems Genitourinary, constitutional, skin, eye, otolaryngeal, hematologic/lymphatic, cardiovascular, pulmonary, endocrine, musculoskeletal, gastrointestinal, neurological and psychiatric system(s) were reviewed and pertinent findings if present are noted and are otherwise negative.  Constitutional: recent 2 lb weight gain.    Vitals Vital Signs [Data Includes: Last 1 Day]  Recorded: 21Jan2016 11:51AM  Blood Pressure: 149 / 95 Temperature: 97 F Heart Rate: 62  Physical Exam Constitutional: Well nourished and well developed . No acute distress.  Pulmonary: No respiratory distress and normal respiratory rhythm and effort.  Cardiovascular: Heart rate and rhythm are normal . No peripheral edema.    Results/Data Urine [Data Includes: Last 1 Day]   [id number]  COLOR YELLOW   APPEARANCE CLEAR   SPECIFIC GRAVITY 1.020   pH 6.5   GLUCOSE NEG mg/dL  BILIRUBIN NEG   KETONE TRACE mg/dL  BLOOD NEG   PROTEIN NEG mg/dL  UROBILINOGEN 0.2 mg/dL  NITRITE NEG   LEUKOCYTE ESTERASE NEG    The following clinical lab reports were reviewed:  UA reviewed.  The following radiology reports were reviewed: CXR was normal.    Assessment Assessed  1. Prostate cancer (C61) 2. 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   Plan Prostate cancer  1. Follow-up Keep Future Appt Office  Follow-up  Status: Hold For - Appointment  Requested  for:  [id number]  He had not done PT so I discussed Kegel exercises with him.  I reviewed the features of the procedure, the recovery and the risks again.   He has preexisting ED and is unlike to recover function post-op and I reinforced this with him.  He lives alone so he will probably need an extra day in the hospital.   Discussion/Summary CC: Dr. Lajean Manes.

## 2014-12-21 ENCOUNTER — Encounter (HOSPITAL_COMMUNITY)
Admission: RE | Disposition: A | Discharge status: Home / Self Care | Payer: Self-pay | Source: Ambulatory Visit | Attending: Urology

## 2014-12-21 ENCOUNTER — Encounter (HOSPITAL_COMMUNITY): Payer: Self-pay | Admitting: *Deleted

## 2014-12-21 ENCOUNTER — Inpatient Hospital Stay (HOSPITAL_COMMUNITY)
Admission: RE | Admit: 2014-12-21 | Discharge: 2014-12-22 | DRG: 708 | Disposition: A | Discharge status: Home / Self Care | Payer: Medicare Other | Source: Ambulatory Visit | Attending: Urology | Admitting: Urology

## 2014-12-21 ENCOUNTER — Inpatient Hospital Stay (HOSPITAL_COMMUNITY): Payer: Medicare Other | Admitting: Anesthesiology

## 2014-12-21 DIAGNOSIS — H409 Unspecified glaucoma: Secondary | ICD-10-CM | POA: Diagnosis present

## 2014-12-21 DIAGNOSIS — K219 Gastro-esophageal reflux disease without esophagitis: Secondary | ICD-10-CM | POA: Diagnosis present

## 2014-12-21 DIAGNOSIS — N5201 Erectile dysfunction due to arterial insufficiency: Secondary | ICD-10-CM | POA: Diagnosis present

## 2014-12-21 DIAGNOSIS — G4730 Sleep apnea, unspecified: Secondary | ICD-10-CM | POA: Diagnosis present

## 2014-12-21 DIAGNOSIS — N433 Hydrocele, unspecified: Secondary | ICD-10-CM | POA: Diagnosis present

## 2014-12-21 DIAGNOSIS — Z87891 Personal history of nicotine dependence: Secondary | ICD-10-CM | POA: Diagnosis not present

## 2014-12-21 DIAGNOSIS — C61 Malignant neoplasm of prostate: Principal | ICD-10-CM | POA: Diagnosis present

## 2014-12-21 DIAGNOSIS — E785 Hyperlipidemia, unspecified: Secondary | ICD-10-CM | POA: Diagnosis present

## 2014-12-21 DIAGNOSIS — Z79899 Other long term (current) drug therapy: Secondary | ICD-10-CM | POA: Diagnosis not present

## 2014-12-21 HISTORY — PX: LYMPH NODE DISSECTION: SHX5087

## 2014-12-21 HISTORY — PX: ROBOT ASSISTED LAPAROSCOPIC RADICAL PROSTATECTOMY: SHX5141

## 2014-12-21 LAB — TYPE AND SCREEN
ABO/RH(D): O POS
ANTIBODY SCREEN: NEGATIVE

## 2014-12-21 LAB — ABO/RH: ABO/RH(D): O POS

## 2014-12-21 LAB — HEMOGLOBIN AND HEMATOCRIT, BLOOD
HCT: 43.6 % (ref 39.0–52.0)
Hemoglobin: 14.3 g/dL (ref 13.0–17.0)

## 2014-12-21 SURGERY — ROBOTIC ASSISTED LAPAROSCOPIC RADICAL PROSTATECTOMY
Anesthesia: General | Site: Prostate

## 2014-12-21 MED ORDER — DEXTROSE-NACL 5-0.45 % IV SOLN
INTRAVENOUS | Status: DC
  Administered 2014-12-21 – 2014-12-22 (×3): via INTRAVENOUS

## 2014-12-21 MED ORDER — LACTATED RINGERS IV SOLN
INTRAVENOUS | Status: DC | PRN
  Administered 2014-12-21: 08:00:00 via INTRAVENOUS

## 2014-12-21 MED ORDER — GLYCOPYRROLATE 0.2 MG/ML IJ SOLN
INTRAMUSCULAR | Status: AC
  Filled 2014-12-21: qty 1

## 2014-12-21 MED ORDER — SODIUM CHLORIDE 0.9 % IV SOLN
INTRAVENOUS | Status: DC | PRN
  Administered 2014-12-21: 11:00:00 via INTRAVENOUS

## 2014-12-21 MED ORDER — ONDANSETRON HCL 4 MG/2ML IJ SOLN
INTRAMUSCULAR | Status: AC
  Filled 2014-12-21: qty 2

## 2014-12-21 MED ORDER — CISATRACURIUM BESYLATE 20 MG/10ML IV SOLN
INTRAVENOUS | Status: AC
  Filled 2014-12-21: qty 10

## 2014-12-21 MED ORDER — EPHEDRINE SULFATE 50 MG/ML IJ SOLN
INTRAMUSCULAR | Status: AC
  Filled 2014-12-21: qty 1

## 2014-12-21 MED ORDER — HYDROCODONE-ACETAMINOPHEN 5-325 MG PO TABS: 1.0000 | ORAL_TABLET | Freq: Four times a day (QID) | ORAL | Status: DC | PRN

## 2014-12-21 MED ORDER — SUCCINYLCHOLINE CHLORIDE 20 MG/ML IJ SOLN
INTRAMUSCULAR | Status: DC | PRN
  Administered 2014-12-21: 100 mg via INTRAVENOUS

## 2014-12-21 MED ORDER — SODIUM CHLORIDE 0.9 % IR SOLN
Status: DC | PRN
  Administered 2014-12-21: 300 mL via INTRAVESICAL

## 2014-12-21 MED ORDER — DEXAMETHASONE SODIUM PHOSPHATE 10 MG/ML IJ SOLN
INTRAMUSCULAR | Status: AC
  Filled 2014-12-21: qty 1

## 2014-12-21 MED ORDER — CEFAZOLIN SODIUM-DEXTROSE 2-3 GM-% IV SOLR
2.0000 g | INTRAVENOUS | Status: AC
  Administered 2014-12-21: 2 g via INTRAVENOUS

## 2014-12-21 MED ORDER — SUFENTANIL CITRATE 50 MCG/ML IV SOLN
INTRAVENOUS | Status: AC
  Filled 2014-12-21: qty 1

## 2014-12-21 MED ORDER — SODIUM CHLORIDE 0.9 % IV BOLUS (SEPSIS)
500.0000 mL | Freq: Once | INTRAVENOUS | Status: AC
  Administered 2014-12-21: 500 mL via INTRAVENOUS

## 2014-12-21 MED ORDER — SODIUM CHLORIDE 0.9 % IJ SOLN
INTRAMUSCULAR | Status: AC
  Filled 2014-12-21: qty 10

## 2014-12-21 MED ORDER — SODIUM CHLORIDE 0.9 % IV BOLUS (SEPSIS)
1000.0000 mL | Freq: Once | INTRAVENOUS | Status: AC
  Administered 2014-12-21: 1000 mL via INTRAVENOUS

## 2014-12-21 MED ORDER — ONDANSETRON HCL 4 MG/2ML IJ SOLN
INTRAMUSCULAR | Status: DC | PRN
  Administered 2014-12-21: 4 mg via INTRAVENOUS

## 2014-12-21 MED ORDER — HEPARIN SODIUM (PORCINE) 1000 UNIT/ML IJ SOLN
INTRAMUSCULAR | Status: AC
  Filled 2014-12-21: qty 1

## 2014-12-21 MED ORDER — METOCLOPRAMIDE HCL 5 MG/ML IJ SOLN
INTRAMUSCULAR | Status: AC
  Filled 2014-12-21: qty 2

## 2014-12-21 MED ORDER — METOCLOPRAMIDE HCL 5 MG/ML IJ SOLN
INTRAMUSCULAR | Status: DC | PRN
  Administered 2014-12-21: 10 mg via INTRAVENOUS

## 2014-12-21 MED ORDER — HYDROMORPHONE HCL 1 MG/ML IJ SOLN
INTRAMUSCULAR | Status: AC
  Filled 2014-12-21: qty 1

## 2014-12-21 MED ORDER — TIMOLOL MALEATE 0.5 % OP SOLN
1.0000 [drp] | Freq: Two times a day (BID) | OPHTHALMIC | Status: DC
  Administered 2014-12-21 – 2014-12-22 (×2): 1 [drp] via OPHTHALMIC
  Filled 2014-12-21: qty 5

## 2014-12-21 MED ORDER — OXYCODONE HCL 5 MG PO TABS: 5.0000 mg | ORAL_TABLET | ORAL | Status: DC | PRN

## 2014-12-21 MED ORDER — ACETAMINOPHEN 325 MG PO TABS: 650.0000 mg | ORAL_TABLET | ORAL | Status: DC | PRN

## 2014-12-21 MED ORDER — HYDROMORPHONE HCL 1 MG/ML IJ SOLN
0.2500 mg | INTRAMUSCULAR | Status: DC | PRN
  Administered 2014-12-21 (×2): 0.5 mg via INTRAVENOUS

## 2014-12-21 MED ORDER — PHENYLEPHRINE HCL 10 MG/ML IJ SOLN
INTRAMUSCULAR | Status: DC | PRN
  Administered 2014-12-21: 120 ug via INTRAVENOUS

## 2014-12-21 MED ORDER — CHLORHEXIDINE GLUCONATE 0.12 % MT SOLN
15.0000 mL | Freq: Two times a day (BID) | OROMUCOSAL | Status: DC
  Administered 2014-12-21 – 2014-12-22 (×2): 15 mL via OROMUCOSAL
  Filled 2014-12-21 (×2): qty 15

## 2014-12-21 MED ORDER — BUPIVACAINE-EPINEPHRINE (PF) 0.25% -1:200000 IJ SOLN
INTRAMUSCULAR | Status: AC
  Filled 2014-12-21: qty 30

## 2014-12-21 MED ORDER — CIPROFLOXACIN HCL 500 MG PO TABS: 500.0000 mg | ORAL_TABLET | Freq: Two times a day (BID) | ORAL | Status: DC

## 2014-12-21 MED ORDER — EPHEDRINE SULFATE 50 MG/ML IJ SOLN
INTRAMUSCULAR | Status: DC | PRN
  Administered 2014-12-21 (×2): 7.5 mg via INTRAVENOUS

## 2014-12-21 MED ORDER — GLYCOPYRROLATE 0.2 MG/ML IJ SOLN
INTRAMUSCULAR | Status: DC | PRN
  Administered 2014-12-21: 0.6 mg via INTRAVENOUS
  Administered 2014-12-21: 0.2 mg via INTRAVENOUS

## 2014-12-21 MED ORDER — CETYLPYRIDINIUM CHLORIDE 0.05 % MT LIQD
7.0000 mL | Freq: Two times a day (BID) | OROMUCOSAL | Status: DC
  Administered 2014-12-21 – 2014-12-22 (×2): 7 mL via OROMUCOSAL

## 2014-12-21 MED ORDER — PROPOFOL 10 MG/ML IV BOLUS
INTRAVENOUS | Status: AC
  Filled 2014-12-21: qty 20

## 2014-12-21 MED ORDER — NEOSTIGMINE METHYLSULFATE 10 MG/10ML IV SOLN
INTRAVENOUS | Status: DC | PRN
  Administered 2014-12-21: 4 mg via INTRAVENOUS

## 2014-12-21 MED ORDER — ETOMIDATE 2 MG/ML IV SOLN
INTRAVENOUS | Status: DC | PRN
Start: 2014-12-21 — End: 2014-12-21
  Administered 2014-12-21: 10 mg via INTRAVENOUS

## 2014-12-21 MED ORDER — DEXAMETHASONE SODIUM PHOSPHATE 10 MG/ML IJ SOLN
INTRAMUSCULAR | Status: DC | PRN
  Administered 2014-12-21: 10 mg via INTRAVENOUS

## 2014-12-21 MED ORDER — LACTATED RINGERS IV SOLN: INTRAVENOUS | Status: DC

## 2014-12-21 MED ORDER — KETOROLAC TROMETHAMINE 15 MG/ML IJ SOLN
15.0000 mg | Freq: Four times a day (QID) | INTRAMUSCULAR | Status: DC
  Administered 2014-12-21 – 2014-12-22 (×4): 15 mg via INTRAVENOUS
  Filled 2014-12-21 (×4): qty 1

## 2014-12-21 MED ORDER — CISATRACURIUM BESYLATE (PF) 10 MG/5ML IV SOLN
INTRAVENOUS | Status: DC | PRN
  Administered 2014-12-21: 3 mg via INTRAVENOUS
  Administered 2014-12-21: 8 mg via INTRAVENOUS
  Administered 2014-12-21: 3 mg via INTRAVENOUS

## 2014-12-21 MED ORDER — PHENYLEPHRINE 40 MCG/ML (10ML) SYRINGE FOR IV PUSH (FOR BLOOD PRESSURE SUPPORT)
PREFILLED_SYRINGE | INTRAVENOUS | Status: AC
  Filled 2014-12-21: qty 10

## 2014-12-21 MED ORDER — BUPIVACAINE-EPINEPHRINE 0.25% -1:200000 IJ SOLN
INTRAMUSCULAR | Status: DC | PRN
  Administered 2014-12-21: 30 mL

## 2014-12-21 MED ORDER — GLYCOPYRROLATE 0.2 MG/ML IJ SOLN
INTRAMUSCULAR | Status: AC
  Filled 2014-12-21: qty 3

## 2014-12-21 MED ORDER — PROPOFOL 10 MG/ML IV BOLUS
INTRAVENOUS | Status: DC | PRN
  Administered 2014-12-21: 100 mg via INTRAVENOUS

## 2014-12-21 MED ORDER — HYDROMORPHONE HCL 1 MG/ML IJ SOLN
0.5000 mg | INTRAMUSCULAR | Status: DC | PRN
  Administered 2014-12-21: 1 mg via INTRAVENOUS

## 2014-12-21 MED ORDER — LATANOPROST 0.005 % OP SOLN
1.0000 [drp] | Freq: Every day | OPHTHALMIC | Status: DC
  Administered 2014-12-21: 1 [drp] via OPHTHALMIC
  Filled 2014-12-21: qty 2.5

## 2014-12-21 MED ORDER — CEFAZOLIN SODIUM-DEXTROSE 2-3 GM-% IV SOLR
INTRAVENOUS | Status: AC
Start: 2014-12-21 — End: 2014-12-21
  Filled 2014-12-21: qty 50

## 2014-12-21 MED ORDER — LACTATED RINGERS IR SOLN
Status: DC | PRN
  Administered 2014-12-21: 50 mL

## 2014-12-21 MED ORDER — ONDANSETRON HCL 4 MG/2ML IJ SOLN
4.0000 mg | INTRAMUSCULAR | Status: DC | PRN
  Administered 2014-12-21: 4 mg via INTRAVENOUS

## 2014-12-21 MED ORDER — SUFENTANIL CITRATE 50 MCG/ML IV SOLN
INTRAVENOUS | Status: DC | PRN
  Administered 2014-12-21 (×2): 10 ug via INTRAVENOUS

## 2014-12-21 SURGICAL SUPPLY — 45 items
CABLE HIGH FREQUENCY MONO STRZ (ELECTRODE) ×4 IMPLANT
CATH FOLEY 2WAY SLVR 18FR 30CC (CATHETERS) ×4 IMPLANT
CATH ROBINSON RED A/P 16FR (CATHETERS) ×4 IMPLANT
CATH TIEMANN FOLEY 18FR 5CC (CATHETERS) ×4 IMPLANT
CHLORAPREP W/TINT 26ML (MISCELLANEOUS) ×4 IMPLANT
CLOTH BEACON ORANGE TIMEOUT ST (SAFETY) ×4 IMPLANT
COVER SURGICAL LIGHT HANDLE (MISCELLANEOUS) ×4 IMPLANT
COVER TIP SHEARS 8 DVNC (MISCELLANEOUS) ×2 IMPLANT
COVER TIP SHEARS 8MM DA VINCI (MISCELLANEOUS) ×2
CUTTER ECHEON FLEX ENDO 45 340 (ENDOMECHANICALS) ×4 IMPLANT
DECANTER SPIKE VIAL GLASS SM (MISCELLANEOUS) ×2 IMPLANT
DRAPE SURG IRRIG POUCH 19X23 (DRAPES) ×4 IMPLANT
DRSG TEGADERM 2-3/8X2-3/4 SM (GAUZE/BANDAGES/DRESSINGS) ×8 IMPLANT
DRSG TEGADERM 4X4.75 (GAUZE/BANDAGES/DRESSINGS) ×6 IMPLANT
DRSG TEGADERM 6X8 (GAUZE/BANDAGES/DRESSINGS) ×4 IMPLANT
ELECT REM PT RETURN 9FT ADLT (ELECTROSURGICAL) ×4
ELECTRODE REM PT RTRN 9FT ADLT (ELECTROSURGICAL) ×2 IMPLANT
GLOVE BIO SURGEON STRL SZ 6.5 (GLOVE) ×4 IMPLANT
GLOVE BIO SURGEONS STRL SZ 6.5 (GLOVE) ×2
GLOVE SURG SS PI 8.0 STRL IVOR (GLOVE) ×8 IMPLANT
GOWN STRL REUS W/TWL LRG LVL3 (GOWN DISPOSABLE) ×4 IMPLANT
GOWN STRL REUS W/TWL XL LVL3 (GOWN DISPOSABLE) ×8 IMPLANT
HOLDER FOLEY CATH W/STRAP (MISCELLANEOUS) ×4 IMPLANT
IV LACTATED RINGERS 1000ML (IV SOLUTION) ×4 IMPLANT
KIT ACCESSORY DA VINCI DISP (KITS) ×2
KIT ACCESSORY DVNC DISP (KITS) ×2 IMPLANT
LIQUID BAND (GAUZE/BANDAGES/DRESSINGS) ×4 IMPLANT
MANIFOLD NEPTUNE II (INSTRUMENTS) ×4 IMPLANT
NDL SAFETY ECLIPSE 18X1.5 (NEEDLE) ×2 IMPLANT
NEEDLE HYPO 18GX1.5 SHARP (NEEDLE) ×8
PACK ROBOT UROLOGY CUSTOM (CUSTOM PROCEDURE TRAY) ×4 IMPLANT
RELOAD GREEN ECHELON 45 (STAPLE) ×4 IMPLANT
SEALER TISSUE G2 CVD JAW 45CM (ENDOMECHANICALS) ×4 IMPLANT
SET TUBE IRRIG SUCTION NO TIP (IRRIGATION / IRRIGATOR) ×4 IMPLANT
SOLUTION ELECTROLUBE (MISCELLANEOUS) ×4 IMPLANT
SUT MNCRL AB 4-0 PS2 18 (SUTURE) ×8 IMPLANT
SUT VIC AB 0 CT1 36 (SUTURE) ×4 IMPLANT
SUT VIC AB 2-0 SH 27 (SUTURE) ×8
SUT VIC AB 2-0 SH 27X BRD (SUTURE) ×2 IMPLANT
SUT VICRYL 0 UR6 27IN ABS (SUTURE) ×8 IMPLANT
SUT VLOC BARB 180 ABS3/0GR12 (SUTURE) ×8
SUTURE VLOC BRB 180 ABS3/0GR12 (SUTURE) ×4 IMPLANT
SYR 27GX1/2 1ML LL SAFETY (SYRINGE) ×6 IMPLANT
TOWEL OR NON WOVEN STRL DISP B (DISPOSABLE) ×8 IMPLANT
WATER STERILE IRR 1500ML POUR (IV SOLUTION) ×8 IMPLANT

## 2014-12-21 NOTE — Transfer of Care (Signed)
Immediate Anesthesia Transfer of Care Note  Patient: Anthony Glenn  Procedure(s) Performed: Procedure(s): ROBOTIC ASSISTED LAPAROSCOPIC RADICAL PROSTATECTOMY (N/A) BILATERAL LYMPH NODE DISSECTION (Bilateral)  Patient Location: PACU  Anesthesia Type:General  Level of Consciousness: awake, sedated and patient cooperative  Airway & Oxygen Therapy: Patient Spontanous Breathing and Patient connected to face mask oxygen  Post-op Assessment: Report given to RN and Post -op Vital signs reviewed and stable  Post vital signs: Reviewed and stable  Last Vitals:  Filed Vitals:   12/21/14 0629  BP: 146/91  Pulse: 94  Temp: 36.4 C  Resp: 18    Complications: No apparent anesthesia complications

## 2014-12-21 NOTE — Anesthesia Procedure Notes (Signed)
Procedure Name: Intubation Date/Time: 12/21/2014 8:49 AM Performed by: Johnathan Hausen A Pre-anesthesia Checklist: Patient identified, Emergency Drugs available, Suction available, Patient being monitored and Timeout performed Patient Re-evaluated:Patient Re-evaluated prior to inductionOxygen Delivery Method: Circle system utilized Preoxygenation: Pre-oxygenation with 100% oxygen Intubation Type: Combination inhalational/ intravenous induction Ventilation: Mask ventilation without difficulty Laryngoscope Size: Mac and 4 Grade View: Grade II Tube type: Oral Tube size: 8.0 mm Number of attempts: 2 Airway Equipment and Method: Stylet Placement Confirmation: ETT inserted through vocal cords under direct vision,  positive ETCO2 and breath sounds checked- equal and bilateral Secured at: 23 cm Tube secured with: Tape Dental Injury: Injury to lip and Teeth and Oropharynx as per pre-operative assessment

## 2014-12-21 NOTE — Discharge Instructions (Signed)

## 2014-12-21 NOTE — Progress Notes (Signed)
Foley catheter hand irrigated per order with 60cc NS.  No clots evacuated.  Will continue to monitor output. Stacey Drain

## 2014-12-21 NOTE — Interval H&P Note (Signed)
History and Physical Interval Note:  12/21/2014 8:07 AM  Anthony Glenn  has presented today for surgery, with the diagnosis of PROSTATE CANCER  The various methods of treatment have been discussed with the patient and family. After consideration of risks, benefits and other options for treatment, the patient has consented to  Procedure(s): ROBOTIC ASSISTED LAPAROSCOPIC RADICAL PROSTATECTOMY (N/A) BILATERAL LYMPH NODE DISSECTION (Bilateral) as a surgical intervention .  The patient's history has been reviewed, patient examined, no change in status, stable for surgery.  I have reviewed the patient's chart and labs.  Questions were answered to the patient's satisfaction.     Kaden Daughdrill J

## 2014-12-21 NOTE — Op Note (Signed)
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 Robotic assisted laparoscopic radical prostatectomy bilateral nerve sparing 2. Bilateral robotic assisted laparoscopic pelvic lymphadenectomy  Surgeon: Irine Seal M.D.  Assistant:  Debbrah Alar PA    Anesthesia: General  Complications: None  EBL: 50 mL   Specimens: 1. Prostate and seminal vesicles 2. bilateral pelvic lymph nodes  Disposition of specimens: Pathology  Drains: 1. 20 Fr coude catheter 2. # 19 Blake pelvic drain  Indication: Anthony Glenn is a 69 y.o. year old patient with clinically localized T1c Nx M0 Gleason 8 prostate cancer.  After a thorough review of the management options for treatment of prostate cancer, he elected to proceed with surgical therapy and the above procedure(s).  We have discussed the potential benefits and risks of the procedure, side effects of the proposed treatment, the likelihood of the patient achieving the goals of the procedure, and any potential problems that might occur during the procedure or recuperation. Informed consent has been obtained.  Description of procedure:  The patient was taken to the operating room and a general anesthetic was administered. He was given preoperative antibiotics, placed in the dorsal lithotomy position.  PAS hose were placed and a red rubber rectal catheter was placed, secured with tape and attached to an asepto syringe.  He was then prepped with chloroprep on the abdomen and betadine on the genitalia and then draped in the usual sterile fashion . Next a preoperative timeout was performed.   A urethral catheter was placed into the bladder and a site was selected near the umbilicus for placement of the camera port. This was placed using a standard open Hassan  technique which allowed entry into the peritoneal cavity under direct vision and without difficulty. A 12 mm port was placed and a pneumoperitoneum established. The camera was then used to inspect the abdomen and there was no evidence of any intra-abdominal injuries or other abnormalities. The remaining abdominal ports were then placed. 8 mm robotic ports were placed in the right lower quadrant, left lower quadrant, and far left lateral abdominal wall. A 5 mm port was placed in the right upper quadrant and a 12 mm port was placed in the right lateral abdominal wall for laparoscopic assistance. All ports were placed under direct vision without difficulty. The surgical cart was then docked.   Utilizing the cautery scissors, the bladder was reflected posteriorly allowing entry into the space of Retzius and identification of the endopelvic fascia and prostate. The periprostatic fat was then removed from the prostate allowing full exposure of the endopelvic fascia. The endopelvic fascia was then incised from the apex back to the base of the prostate bilaterally and the underlying levator muscle fibers were swept laterally off the prostate thereby isolating the dorsal venous complex. The dorsal vein was then stapled and divided with a 45 mm Flex Echelon stapler. Attention then turned to the bladder neck which was divided anteriorly thereby allowing entry into the bladder and exposure of the urethral catheter. The catheter balloon was deflated and the catheter was brought into the operative field and used to retract the prostate anteriorly. The posterior bladder neck was then examined and was divided allowing further dissection between the bladder and prostate posteriorly until the vasa deferentia and seminal vessels were identified. The vasa deferentia were isolated, divided, and lifted anteriorly. The  seminal vesicles were dissected down to their tips with care to control the seminal vascular arterial blood supply.  These structures were then lifted anteriorly and the space between Denonvillier's fascia and the anterior rectum was developed with a combination of sharp and blunt dissection. This isolated the vascular pedicles of the prostate.  The lateral prostatic fascia was then sharply incised allowing release of the neurovascular bundles bilaterally. The vascular pedicles of the prostate were then ligated with the Enseal  between the prostate and neurovascular bundles and divided with sharp cold scissor dissection resulting in bilateral neurovascular bundle preservation. The neurovascular bundles were then separated off the apex of the prostate and urethra bilaterally.  The urethra was then sharply transected allowing the prostate specimen to be disarticulated. The pelvis was copiously irrigated and hemostasis was ensured. There was no evidence for rectal injury.  Attention then turned to the right pelvic sidewall. The fibrofatty tissue between the external iliac vein, confluence of the iliac vessels, hypogastric artery, and Cooper's ligament was dissected free from the pelvic sidewall with care to preserve the obturator nerve. Weck clips were used for lymphostasis and hemostasis. An identical procedure was performed on the contralateral side and the lymphatic packets were removed for permanent pathologic analysis.  Attention then turned to the urethral anastomosis. A 2-0 Vicryl slip knot was placed between Denonvillier's fascia, the posterior bladder neck, and the posterior urethra to reapproximate these structures. A double-armed V-lock Monocryl suture was then used to perform a 360 running tension-free anastomosis between the bladder neck and urethra.   A new urethral catheter was then placed into the bladder and irrigated. There were no blood clots within the bladder and the anastomosis appeared to be watertight. A #19 Blake drain was then brought through the left lateral 8 mm port site and positioned  appropriately within the pelvis. It was secured to the skin with a nylon suture. The surgical cart was then undocked. The right lateral 12 mm port site was closed at the fascial level with a 0 Vicryl suture placed laparoscopically. All remaining ports were then removed under direct vision.  The prostate specimen was removed intact within the Endopouch retrieval bag via the periumbilical camera port site. This fascial opening was closed with running 0 Vicryl sutures. 0.25% Marcaine was then injected into all port sites and all incisions were reapproximated at the skin level with 4-0 monocryl and Dermabond. The patient appeared to tolerate the procedure well and without complications. The patient was able to be extubated and transferred to the recovery unit in satisfactory condition.

## 2014-12-21 NOTE — Progress Notes (Signed)
Post-op note  Subjective: The patient is doing well.  No complaints.  Low UO; only 50cc post op.  Objective: Vital signs in last 24 hours: Temp:  [97.4 F (36.3 C)-97.7 F (36.5 C)] 97.5 F (36.4 C) (02/03 1228) Pulse Rate:  [57-94] 57 (02/03 1228) Resp:  [12-18] 12 (02/03 1228) BP: (118-146)/(62-91) 124/82 mmHg (02/03 1228) SpO2:  [92 %-99 %] 97 % (02/03 1230) Weight:  [117.028 kg (258 lb)] 117.028 kg (258 lb) (02/03 0644)  Intake/Output from previous day:   Intake/Output this shift: Total I/O In: 3273.3 [I.V.:2273.3; IV Piggyback:1000] Out: 195 [Urine:50; Drains:45; Blood:100]  Physical Exam:  General: Alert and oriented. Abdomen: Soft, Nondistended. Incisions: Clean and dry. Urine: dark red  Lab Results:  Recent Labs  12/21/14 1145  HGB 14.3  HCT 43.6    Assessment/Plan: POD#0   1) Continue to monitor  2) DVT prophy, clears, IS, amb, pain control  3) Monitor UO.  500cc NS bolus now and gentle hand irrigation of cath to ensure no clots    LOS: 0 days   Anthony Glenn 12/21/2014, 4:52 PM

## 2014-12-21 NOTE — Anesthesia Postprocedure Evaluation (Signed)
  Anesthesia Post-op Note  Patient: Anthony Glenn  Procedure(s) Performed: Procedure(s) (LRB): ROBOTIC ASSISTED LAPAROSCOPIC RADICAL PROSTATECTOMY (N/A) BILATERAL LYMPH NODE DISSECTION (Bilateral)  Patient Location: PACU  Anesthesia Type: General  Level of Consciousness: awake and alert   Airway and Oxygen Therapy: Patient Spontanous Breathing  Post-op Pain: mild  Post-op Assessment: Post-op Vital signs reviewed, Patient's Cardiovascular Status Stable, Respiratory Function Stable, Patent Airway and No signs of Nausea or vomiting  Last Vitals:  Filed Vitals:   12/21/14 1215  BP: 118/62  Pulse: 58  Temp: 36.3 C  Resp: 14    Post-op Vital Signs: stable   Complications: No apparent anesthesia complications

## 2014-12-21 NOTE — Anesthesia Preprocedure Evaluation (Addendum)
Anesthesia Evaluation  Patient identified by MRN, date of birth, ID band Patient awake    Reviewed: Allergy & Precautions, H&P , NPO status , Patient's Chart, lab work & pertinent test results, reviewed documented beta blocker date and time   Airway Mallampati: III  TM Distance: >3 FB Neck ROM: full    Dental no notable dental hx. (+) Teeth Intact, Dental Advisory Given   Pulmonary sleep apnea and Continuous Positive Airway Pressure Ventilation , former smoker,  breath sounds clear to auscultation  Pulmonary exam normal       Cardiovascular Exercise Tolerance: Good hypertension, Pt. on home beta blockers and Pt. on medications negative cardio ROS  Rhythm:regular Rate:Normal     Neuro/Psych glaucoma negative neurological ROS  negative psych ROS   GI/Hepatic negative GI ROS, Neg liver ROS, GERD-  Medicated and Controlled,  Endo/Other  negative endocrine ROS  Renal/GU negative Renal ROS  negative genitourinary   Musculoskeletal   Abdominal   Peds  Hematology negative hematology ROS (+)   Anesthesia Other Findings   Reproductive/Obstetrics negative OB ROS                           Anesthesia Physical Anesthesia Plan  ASA: III  Anesthesia Plan: General   Post-op Pain Management:    Induction: Intravenous  Airway Management Planned: Oral ETT  Additional Equipment:   Intra-op Plan:   Post-operative Plan: Extubation in OR  Informed Consent: I have reviewed the patients History and Physical, chart, labs and discussed the procedure including the risks, benefits and alternatives for the proposed anesthesia with the patient or authorized representative who has indicated his/her understanding and acceptance.   Dental Advisory Given  Plan Discussed with: CRNA and Surgeon  Anesthesia Plan Comments:         Anesthesia Quick Evaluation

## 2014-12-22 ENCOUNTER — Encounter (HOSPITAL_COMMUNITY): Payer: Self-pay | Admitting: Urology

## 2014-12-22 LAB — BASIC METABOLIC PANEL
Anion gap: 5 (ref 5–15)
BUN: 25 mg/dL — AB (ref 6–23)
CALCIUM: 8.3 mg/dL — AB (ref 8.4–10.5)
CO2: 27 mmol/L (ref 19–32)
Chloride: 103 mmol/L (ref 96–112)
Creatinine, Ser: 1.15 mg/dL (ref 0.50–1.35)
GFR calc Af Amer: 73 mL/min — ABNORMAL LOW (ref >90)
GFR, EST NON AFRICAN AMERICAN: 63 mL/min — AB (ref >90)
GLUCOSE: 138 mg/dL — AB (ref 70–99)
Potassium: 4.4 mmol/L (ref 3.5–5.1)
SODIUM: 135 mmol/L (ref 135–145)

## 2014-12-22 LAB — HEMOGLOBIN AND HEMATOCRIT, BLOOD
HCT: 41.4 % (ref 39.0–52.0)
HEMOGLOBIN: 13.7 g/dL (ref 13.0–17.0)

## 2014-12-22 MED ORDER — BISACODYL 10 MG RE SUPP
10.0000 mg | Freq: Once | RECTAL | Status: AC
  Administered 2014-12-22: 10 mg via RECTAL
  Filled 2014-12-22: qty 1

## 2014-12-22 NOTE — Progress Notes (Signed)
Pt states he takes Metoprolol ER 50mg  PO QD and Lisinopril/HCTZ 20-12.5mg  PO QD and they are not ordered per pt's MAR. Spoke to [place] @ Dr. Ralene [place]rat office via telephone. She states she will contact Dr. Jeffie Pollock who is currently in surgery and call back after she speaks to him. Lind Guest, RN

## 2014-12-22 NOTE — Progress Notes (Signed)
CARE MANAGEMENT NOTE 12/22/2014  Patient:  HAVOC, SANLUIS   Account Number:  [id number]  Date Initiated:  12/22/2014  Documentation initiated by:  DAVIS,RHONDA  Subjective/Objective Assessment:   1.   Robotic assisted laparoscopic radical prostatectomy bilateral nerve sparing  2.   Bilateral robotic assisted laparoscopic pelvic lymphadenectomy     Action/Plan:   home later on [id number]   Anticipated DC Date:  12/22/2014   Anticipated DC Plan:  HOME/SELF CARE  In-house referral  NA      DC Planning Services  CM consult      Choice offered to / List presented to:             Status of service:  In process, will continue to follow Medicare Important Message given?   (If response is "NO", the following Medicare IM given date fields will be blank) Date Medicare IM given:   Medicare IM given by:   Date Additional Medicare IM given:   Additional Medicare IM given by:    Discharge Disposition:    Per UR Regulation:  Reviewed for med. necessity/level of care/duration of stay  If discussed at [place] of Stay Meetings, dates discussed:    Comments:  [id number]/Rhonda Eldridge Dace, BSN, CCN: [id number] Case management. Chart reviewed for discharge planning and present needs. Discharge needs: none present at time of review. Next chart review due:  [id number]

## 2014-12-22 NOTE — Discharge Summary (Signed)
  Date of admission: 12/21/2014  Date of discharge: 12/22/2014  Admission diagnosis: Prostate Cancer  Discharge diagnosis: Prostate Cancer  History and Physical: For full details, please see admission history and physical. Briefly, Anthony Glenn is a 69 y.o. gentleman with localized prostate cancer.  After discussing management/treatment options, he elected to proceed with surgical treatment.  Hospital Course: Anthony Glenn was taken to the operating room on 12/21/2014 and underwent a robotic assisted laparoscopic radical prostatectomy. He tolerated this procedure well and without complications. Postoperatively, he was able to be transferred to a regular hospital room following recovery from anesthesia.  He was able to begin ambulating the night of surgery. He remained hemodynamically stable overnight.  He had excellent urine output with appropriately minimal output from his pelvic drain and his pelvic drain was removed on POD #1.  He was transitioned to oral pain medication, tolerated a clear liquid diet, and had met all discharge criteria and was able to be discharged home later on POD#1.  Laboratory values:  Recent Labs  12/21/14 1145 12/22/14 0410  HGB 14.3 13.7  HCT 43.6 41.4    Disposition: Home  Discharge instruction: He was instructed to be ambulatory but to refrain from heavy lifting, strenuous activity, or driving. He was instructed on urethral catheter care.  Discharge medications:     Medication List    TAKE these medications        ciprofloxacin 500 MG tablet  Commonly known as:  CIPRO  Take 1 tablet (500 mg total) by mouth 2 (two) times daily. Start day prior to office visit for foley removal     HYDROcodone-acetaminophen 5-325 MG per tablet  Commonly known as:  NORCO  Take 1-2 tablets by mouth every 6 (six) hours as needed.     latanoprost 0.005 % ophthalmic solution  Commonly known as:  XALATAN  Place 1 drop into both eyes at bedtime.     lisinopril-hydrochlorothiazide 20-12.5 MG per tablet  Commonly known as:  PRINZIDE,ZESTORETIC  Take 1 tablet by mouth every morning.     metoprolol succinate 50 MG 24 hr tablet  Commonly known as:  TOPROL-XL  Take 50 mg by mouth every morning. Take with or immediately following a meal.     omeprazole 20 MG capsule  Commonly known as:  PRILOSEC  Take 20 mg by mouth daily. Patient takes in the pm     timolol 0.5 % ophthalmic solution  Commonly known as:  TIMOPTIC  Place 1 drop into both eyes 2 (two) times daily.        Followup: He will followup in 1 week for catheter removal and to discuss his surgical pathology results.

## 2014-12-22 NOTE — Progress Notes (Signed)
1 Day Post-Op Subjective: The patient is doing well.  No nausea or vomiting. Pain is adequately controlled.  Has ambulated and feels well.  UO improved.   Objective: Vital signs in last 24 hours: Temp:  [97.4 F (36.3 C)-98.9 F (37.2 C)] 97.6 F (36.4 C) (02/04 0533) Pulse Rate:  [57-74] 57 (02/04 0533) Resp:  [12-18] 14 (02/04 0533) BP: (118-139)/(62-90) 118/76 mmHg (02/04 0533) SpO2:  [92 %-99 %] 96 % (02/04 0533)  Intake/Output from previous day: 02/03 0701 - 02/04 0700 In: 5096.3 [P.O.:100; I.V.:3496.3; IV Piggyback:1500] Out: 685 [Urine:500; Drains:85; Blood:100] Intake/Output this shift: Total I/O In: 821.7 [I.V.:791.7; Other:30] Out: 350 [Urine:350]  Physical Exam:  General: Alert and oriented. CV: RRR Lungs: Clear bilaterally. GI: Soft, Nondistended. Incisions: Clean, dry, and intact Urine: Clear Extremities: Nontender, no erythema, no edema.  Lab Results:  Recent Labs  12/21/14 1145 12/22/14 0410  HGB 14.3 13.7  HCT 43.6 41.4      Assessment/Plan: POD# 1 s/p robotic prostatectomy.  1) SL IVF 2) Ambulate, Incentive spirometry 3) Transition to oral pain medication 4) Dulcolax suppository 5) D/C pelvic drain 6) Plan for likely discharge later today   Pryor Curia. MD   LOS: 1 day   [facility], [place] 12/22/2014, 7:30 AM

## 2015-12-25 IMAGING — CR DG CHEST 2V
2 series · 2 of 2 positions shown · non-contrast
Comparison: 11/30/2009

CLINICAL DATA: Preop for prostatectomy

EXAM:
CHEST  2 VIEW

[w chest pa]
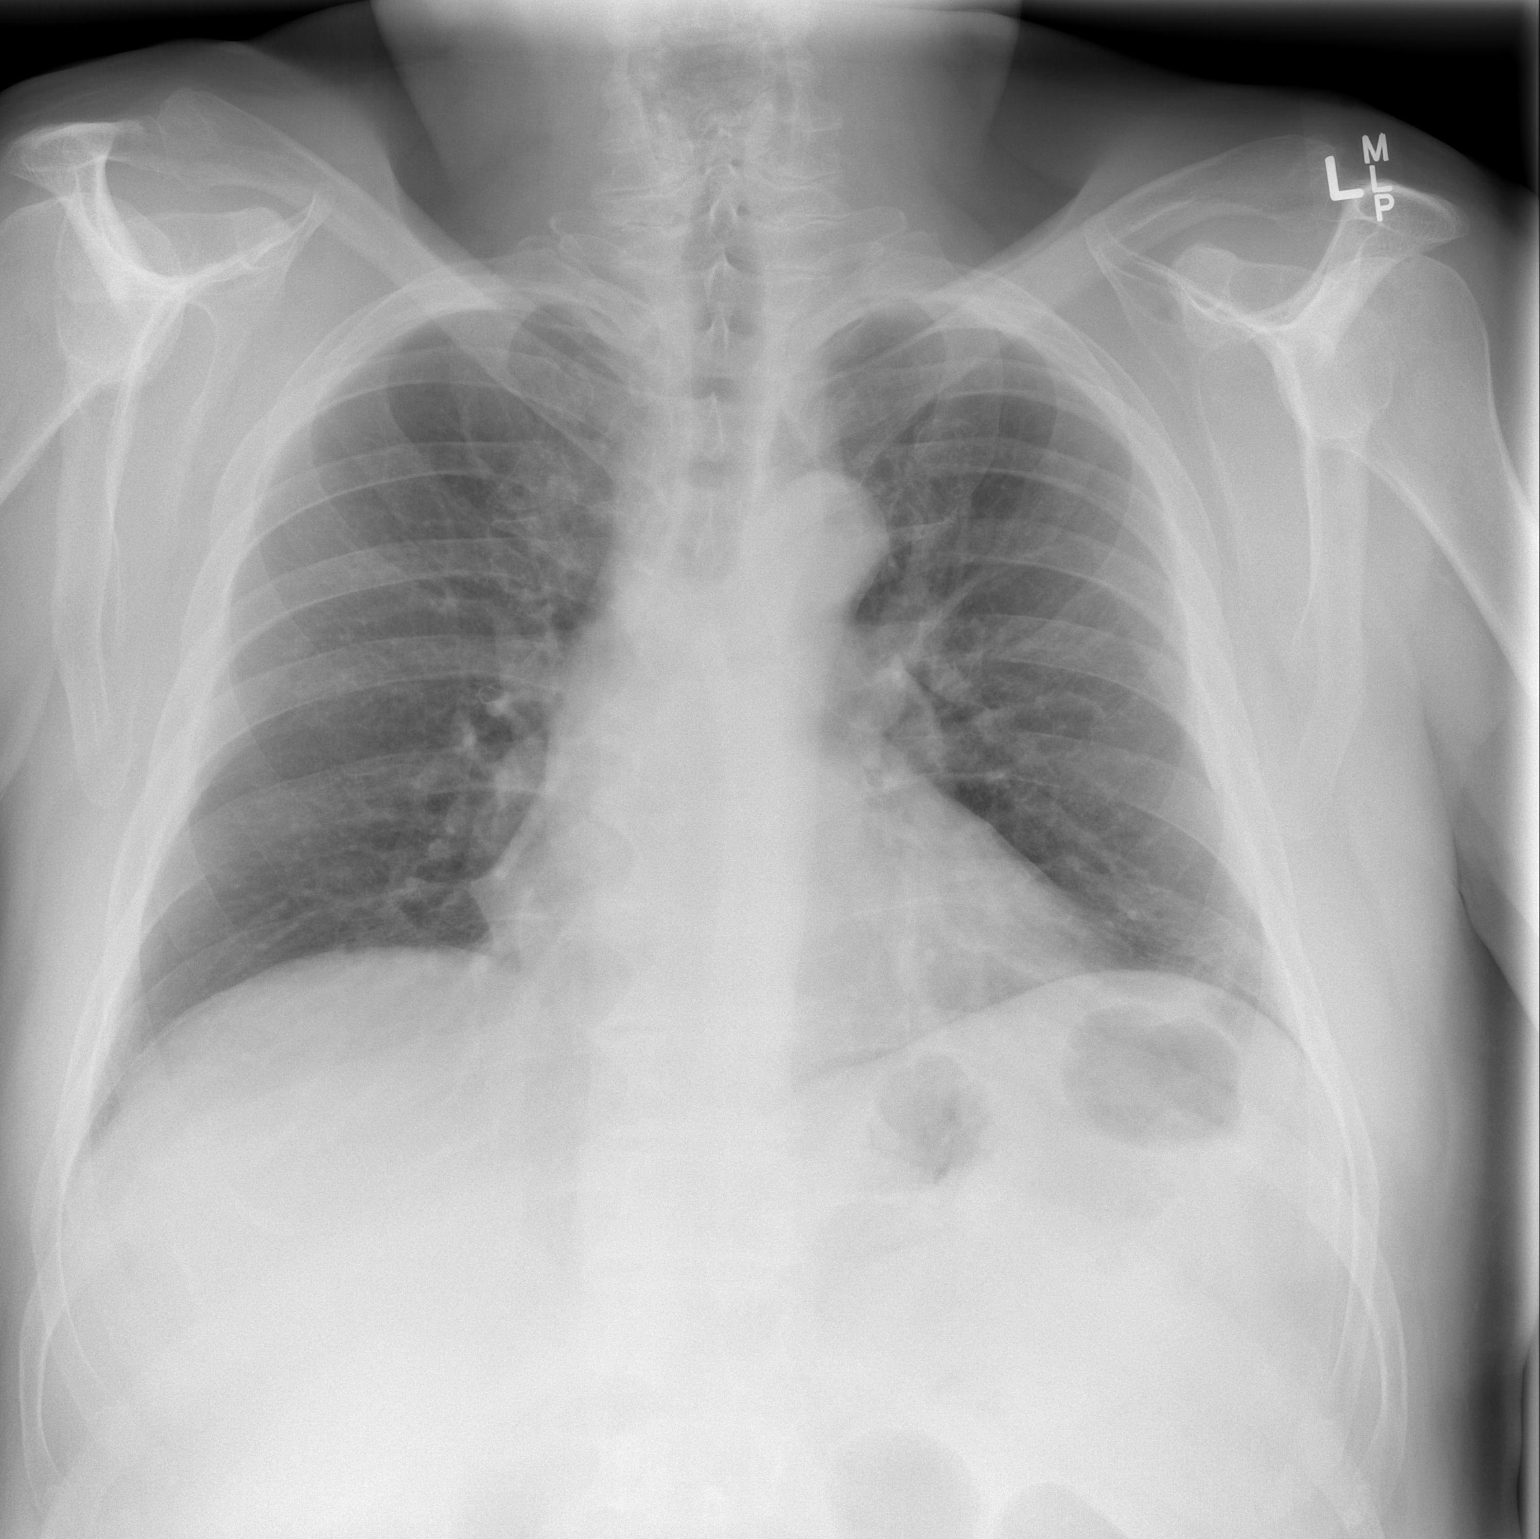

[w chest lat]
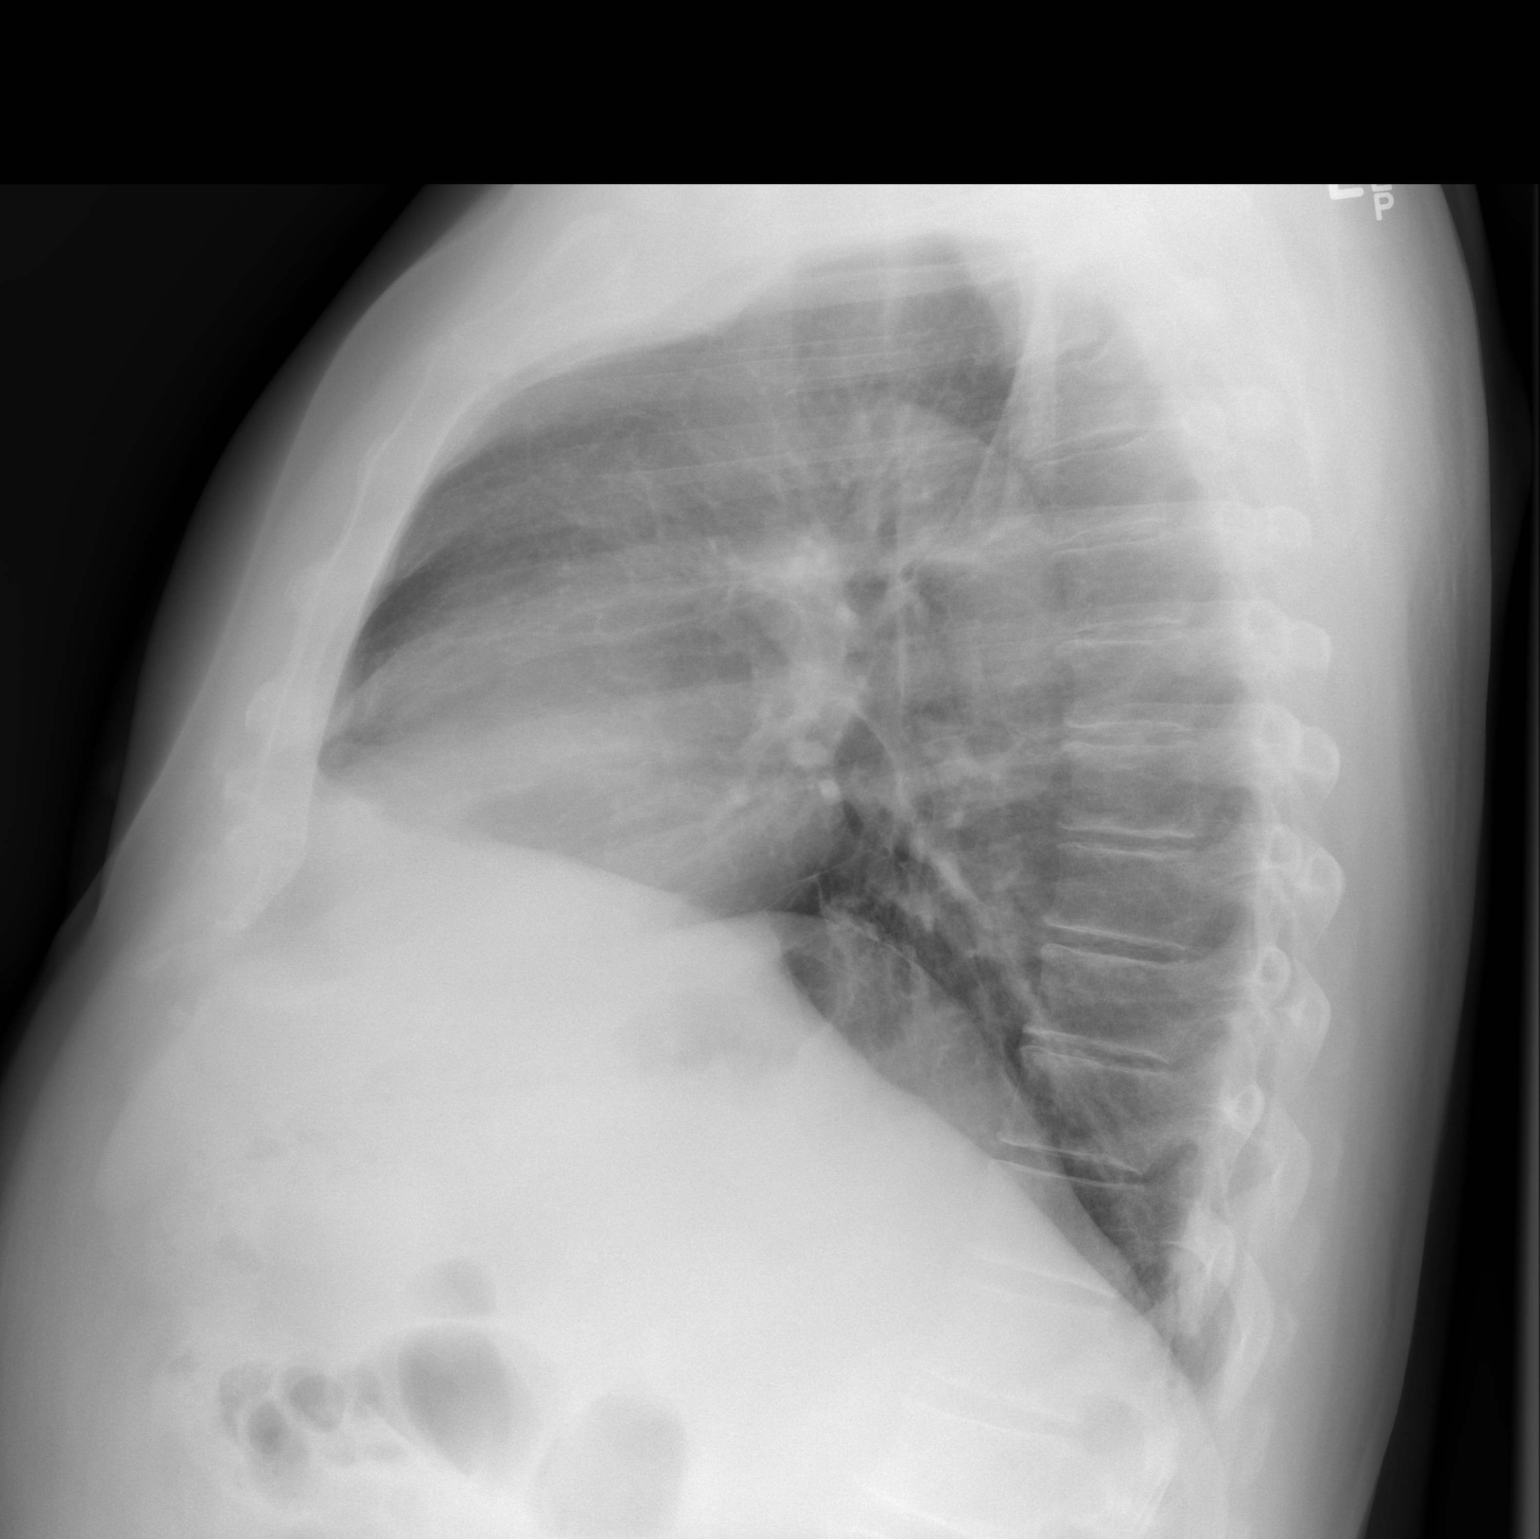

[2 of 2 positions shown; findings below may reference images not displayed]

FINDINGS: Cardiomediastinal silhouette is stable. No acute infiltrate or
pleural effusion. No pulmonary edema. Minimal degenerative changes
thoracic spine.
IMPRESSION: No active cardiopulmonary disease.

## 2018-07-30 ENCOUNTER — Other Ambulatory Visit: Payer: Self-pay | Admitting: Geriatric Medicine

## 2018-07-30 DIAGNOSIS — R7401 Elevation of levels of liver transaminase levels: Secondary | ICD-10-CM

## 2018-07-30 DIAGNOSIS — R740 Nonspecific elevation of levels of transaminase and lactic acid dehydrogenase [LDH]: Principal | ICD-10-CM

## 2018-08-04 ENCOUNTER — Ambulatory Visit
Admission: RE | Admit: 2018-08-04 | Discharge: 2018-08-04 | Disposition: A | Discharge status: Home / Self Care | Payer: BLUE CROSS/BLUE SHIELD | Source: Ambulatory Visit | Attending: Geriatric Medicine | Admitting: Geriatric Medicine

## 2018-08-04 DIAGNOSIS — R7401 Elevation of levels of liver transaminase levels: Secondary | ICD-10-CM

## 2018-08-04 DIAGNOSIS — R740 Nonspecific elevation of levels of transaminase and lactic acid dehydrogenase [LDH]: Principal | ICD-10-CM

## 2019-12-03 ENCOUNTER — Ambulatory Visit: Payer: Medicare Other | Attending: Internal Medicine

## 2019-12-03 DIAGNOSIS — Z23 Encounter for immunization: Secondary | ICD-10-CM

## 2019-12-03 NOTE — Progress Notes (Signed)
   Covid-19 Vaccination Clinic  Name:  Anthony Glenn    MRN: [id number] DOB: 08-03-1946  12/03/2019  Anthony Glenn was observed post Covid-19 immunization for 15 minutes without incidence. He was provided with Vaccine Information Sheet and instruction to access the V-Safe system.   Anthony Glenn was instructed to call 911 with any severe reactions post vaccine: [name] Difficulty breathing  . Swelling of your face and throat  . A fast heartbeat  . A bad rash all over your body  . Dizziness and weakness    Immunizations Administered    Name Date Dose VIS Date Route   Pfizer COVID-19 Vaccine 12/03/2019  8:09 AM 0.3 mL 10/29/2019 Intramuscular   Manufacturer: [place]   Lot: [id number]   [place]: [id number]

## 2019-12-03 NOTE — Progress Notes (Signed)
   Covid-19 Vaccination Clinic  Name:  Anthony Glenn    MRN: [id number] DOB: [birth year]  12/03/2019  [name] was observed post Covid-19 immunization for 15 minutes without incidence. He was provided with Vaccine Information Sheet and instruction to access the V-Safe system.   [name] was instructed to call 911 with any severe reactions post vaccine: [name] Difficulty breathing  . Swelling of your face and throat  . A fast heartbeat  . A bad rash all over your body  . Dizziness and weakness    Immunizations Administered    Name Date Dose VIS Date Route   Pfizer COVID-19 Vaccine 12/03/2019  8:09 AM 0.3 mL 10/29/2019 Intramuscular   Manufacturer: [place]   Lot: [id number]   [place]: [id number]

## 2019-12-21 ENCOUNTER — Ambulatory Visit: Payer: Medicare Other | Attending: Internal Medicine

## 2019-12-21 DIAGNOSIS — Z23 Encounter for immunization: Secondary | ICD-10-CM | POA: Insufficient documentation

## 2019-12-21 NOTE — Progress Notes (Signed)
   Covid-19 Vaccination Clinic  Name:  Anthony Glenn    MRN: [id number] DOB: [birth year]  12/21/2019  Anthony Glenn was observed post Covid-19 immunization for 15 minutes without incidence. He was provided with Vaccine Information Sheet and instruction to access the V-Safe system.   Anthony Glenn was instructed to call 911 with any severe reactions post vaccine: [name] Difficulty breathing  . Swelling of your face and throat  . A fast heartbeat  . A bad rash all over your body  . Dizziness and weakness

## 2020-03-23 ENCOUNTER — Other Ambulatory Visit: Payer: Self-pay | Admitting: Urology

## 2020-03-31 ENCOUNTER — Encounter (HOSPITAL_BASED_OUTPATIENT_CLINIC_OR_DEPARTMENT_OTHER): Payer: Self-pay | Admitting: Urology

## 2020-03-31 ENCOUNTER — Other Ambulatory Visit: Payer: Self-pay

## 2020-03-31 NOTE — Progress Notes (Addendum)
ADDENDUM:  Pt called via phone and updated medication list.  He no longer takes toprol and stated is has been awhile since the time he took it.  He only take lisinopril/ hct for bp and lipitor and two eye drops for glaucoma. Updated instructions and med list in epic.  Spoke w/ via phone for pre-op interview--- PT Lab needs dos---- Coggon and EKG              Lab results------ no COVID test ------ 04-03-2020 @ 0900 Arrive at ------- 0715 NPO after ------ [place] Medications to take morning of surgery ----- Lipitor w/ sips of water and do eye drop as uusal Diabetic medication ----- n/a Patient Special Instructions ----- asked pt to call back on Monday 04-03-2020 to give one more medication that he takes that he was unsure of name then he will need instruction of to take dos. Pre-Op special Istructions ----- n/a Patient verbalized understanding of instructions that were given at this phone interview. Patient denies shortness of breath, chest pain, fever, cough a this phone interview.

## 2020-04-03 ENCOUNTER — Other Ambulatory Visit (HOSPITAL_COMMUNITY)
Admission: RE | Admit: 2020-04-03 | Discharge: 2020-04-03 | Disposition: A | Discharge status: Home / Self Care | Payer: Medicare Other | Source: Ambulatory Visit | Attending: Urology | Admitting: Urology

## 2020-04-03 ENCOUNTER — Encounter (HOSPITAL_BASED_OUTPATIENT_CLINIC_OR_DEPARTMENT_OTHER): Payer: Self-pay | Admitting: Urology

## 2020-04-03 DIAGNOSIS — Z01812 Encounter for preprocedural laboratory examination: Secondary | ICD-10-CM | POA: Insufficient documentation

## 2020-04-03 DIAGNOSIS — Z20822 Contact with and (suspected) exposure to covid-19: Secondary | ICD-10-CM | POA: Diagnosis not present

## 2020-04-03 LAB — SARS CORONAVIRUS 2 (TAT 6-24 HRS): SARS Coronavirus 2: NEGATIVE

## 2020-04-05 NOTE — H&P (Signed)
CC: Right scrotal mass.   Hx: Anthony Glenn is a 74 yo WM who has a history of prostate cancer that I treated with RALP with BPLND in 2016. His PSA remains undectible on labs in 10/20. He returns now with a right scrotal mass found on recent self exam. He has no pain with it. A scrotal US today shows a right hydrocele but normal testes. There is a 1.4cm right epididymal structure with an isoechoic internal echotexture. He had a vasectomy remotely and I noted the hydrocele but no epididymal abnormalities on his last exam in 2017.     ALLERGIES: No Allergies    MEDICATIONS: Atorvastatin Calcium  Dorzolamide-Timolol  Latanoprost 0.005 % drops Ophthalmic  Lisinopril-Hydrochlorothiazide 20 mg-12.5 mg tablet Oral     GU PSH: Laparoscopy; Lymphadenectomy - 2016 Robotic Radical Prostatectomy - 2016 Vasectomy - 2015       PSH Notes: Laparoscopy With Bilateral Total Pelvic Lymphadenectomy, Prostatect Retropubic Radical W/ Nerve Sparing Laparoscopic, Surgery Of Male Genitalia Vasectomy   NON-GU PSH: None   GU PMH: History of prostate cancer (Stable), PSA remains undetectible - 2017, History of prostate cancer, - 2017, History of malignant neoplasm of prostate, - 2016 Stress Incontinence, Male stress incontinence - 2017 ED due to arterial insufficiency, Erectile dysfunction due to arterial insufficiency - 2017 Hydrocele, Unspec, Hydrocele of testis - 2017 Other microscopic hematuria, Microscopic hematuria - 2017 Elevated PSA, Elevated prostate specific antigen (PSA) - 2016    NON-GU PMH: Muscle weakness (generalized), Muscle weakness - 2017 Other lack of coordination, Muscular incoordination - 2017 Encounter for general adult medical examination without abnormal findings, Encounter for preventive health examination - 2016 Personal history of other diseases of the digestive system, History of esophageal reflux - 2015 Personal history of other diseases of the nervous system and sense organs,  History of sleep apnea - 2015, History of glaucoma, - 2015 Personal history of other endocrine, nutritional and metabolic disease, History of hyperlipidemia - 2015 Glaucoma Hypertension    FAMILY HISTORY: Death of family member - Runs In Family Myocardial Infarction - Runs In Family Strokes - Runs In Family   SOCIAL HISTORY: Marital Status: Single Preferred Language: English; Race: White Current Smoking Status: Patient has never smoked.  Drinks 1 caffeinated drink per day. Patient's occupation Information systems manager.     Notes: Activities of daily living (ADL's), independent, Exercise habits, Number of children, Caffeine use, Alcohol use, Former smoker, Widowed, Occupation   REVIEW OF SYSTEMS:    GU Review Male:   Patient reports leakage of urine. Patient denies frequent urination, hard to postpone urination, burning/ pain with urination, get up at night to urinate, stream starts and stops, trouble starting your stream, have to strain to urinate , erection problems, and penile pain.  Gastrointestinal (Upper):   Patient denies nausea, vomiting, and indigestion/ heartburn.  Gastrointestinal (Lower):   Patient denies diarrhea and constipation.  Constitutional:   Patient denies fever, night sweats, weight loss, and fatigue.  Skin:   Patient denies skin rash/ lesion and itching.  Eyes:   Patient denies blurred vision and double vision.  Ears/ Nose/ Throat:   Patient denies sore throat and sinus problems.  Hematologic/Lymphatic:   Patient denies swollen glands and easy bruising.  Cardiovascular:   Patient denies leg swelling and chest pains.  Respiratory:   Patient denies cough and shortness of breath.  Endocrine:   Patient denies excessive thirst.  Musculoskeletal:   Patient denies back pain and joint pain.  Neurological:   Patient  denies headaches and dizziness.  Psychologic:   Patient denies depression and anxiety.   VITAL SIGNS:      03/23/2020 11:20 AM  Weight 228 lb / 103.42 kg  Height  71 in / 180.34 cm  BP 109/69 mmHg  Heart Rate 70 /min  Temperature 97.8 F / 36.5 C  BMI 31.8 kg/m   GU PHYSICAL EXAMINATION:    Scrotum: No lesions. No edema. No cysts. No warts.  Epididymides: Right: right tail contains 1-3 cm mass that doesn't need to transiluminate. Right: No spermatocele, no cysts, no tenderness, no induration, no enlargement. Left: No spermatocele, no masses, no cysts, no tenderness, no induration, no enlargement.   Testes: 3-5 cm soft hydrocele right testis. No tenderness, no swelling, no enlargement left testis. No tenderness, no swelling, no enlargement right testis. Normal location left testis. Normal location right testis. No mass, no cyst, no varicocele, no hydrocele left testis. No mass, no cyst, no varicocele right testis.   Urethral Meatus: Normal size. No lesion, no wart, no discharge, no polyp. Normal location.  Penis: Penis uncircumcised. No foreskin warts, no cracks. No dorsal peyronie's plaques, no left corporal peyronie's plaques, no right corporal peyronie's plaques, no scarring, no shaft warts. No balanitis, no meatal stenosis.   Prostate: Prostate surgically absent.  Seminal Vesicles: Prostate surgically absent.   MULTI-SYSTEM PHYSICAL EXAMINATION:    Constitutional: Well-nourished. No physical deformities. Normally developed. Good grooming.  Respiratory: Normal breath sounds. No labored breathing, no use of accessory muscles.   Cardiovascular: Regular rate and rhythm. No murmur, no gallop.   Gastrointestinal: No hernia. No mass, no tenderness, no rigidity, non obese abdomen.      Complexity of Data:  Lab Test Review:   PSA  Records Review:   Previous Doctor Records, Previous Patient Records  Urine Test Review:   Urinalysis  X-Ray Review: Scrotal Ultrasound: Reviewed Films. Discussed With Patient.     08/24/19 08/08/16 01/30/16 08/08/15 05/16/15 01/17/15  PSA  Total PSA < 0.01 ng/dl < 0.02 ng/dl <0.01  <0.01  <0.01  0.02     PROCEDURES:          Scrotal Ultrasound - [id number]  Right Testicle: Length: 4.9 cm  Height: 3.3 cm  Width: 3.0 cm  Left Testicle: Length: 4.4 cm  Height: 2.1 cm  Width: 3.1 cm  Left Testis/Epididymis:  Testicle and epididymis within normal limits.   Right Testis/Epididymis:  Right hydrocele. Testicle and epididymis within normal limits.      Patient confirmed No Neulasta OnPro Device.           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 I discussed options and will get him set up for right hydrocelectomy and excisional biopsy of the mass. I reviewed the risks of bleeding, infection, testicular injury, thrombotic events and anesthetic complications.   2   Benign epididymal mass - D29.30 Right, Undiagnosed New Problem   PLAN:           Schedule Return Visit/Planned Activity: Next Available Appointment - Schedule Surgery

## 2020-04-06 ENCOUNTER — Other Ambulatory Visit: Payer: Self-pay

## 2020-04-06 ENCOUNTER — Ambulatory Visit (HOSPITAL_BASED_OUTPATIENT_CLINIC_OR_DEPARTMENT_OTHER): Payer: Medicare Other | Admitting: Anesthesiology

## 2020-04-06 ENCOUNTER — Encounter (HOSPITAL_BASED_OUTPATIENT_CLINIC_OR_DEPARTMENT_OTHER)
Admission: RE | Disposition: A | Discharge status: Home / Self Care | Payer: Self-pay | Source: Home / Self Care | Attending: Urology

## 2020-04-06 ENCOUNTER — Encounter (HOSPITAL_BASED_OUTPATIENT_CLINIC_OR_DEPARTMENT_OTHER): Payer: Self-pay | Admitting: Urology

## 2020-04-06 ENCOUNTER — Ambulatory Visit (HOSPITAL_BASED_OUTPATIENT_CLINIC_OR_DEPARTMENT_OTHER)
Admission: RE | Admit: 2020-04-06 | Discharge: 2020-04-06 | Disposition: A | Discharge status: Home / Self Care | Payer: Medicare Other | Attending: Urology | Admitting: Urology

## 2020-04-06 DIAGNOSIS — Z6831 Body mass index (BMI) 31.0-31.9, adult: Secondary | ICD-10-CM | POA: Diagnosis not present

## 2020-04-06 DIAGNOSIS — G4730 Sleep apnea, unspecified: Secondary | ICD-10-CM | POA: Insufficient documentation

## 2020-04-06 DIAGNOSIS — Z87891 Personal history of nicotine dependence: Secondary | ICD-10-CM | POA: Diagnosis not present

## 2020-04-06 DIAGNOSIS — E669 Obesity, unspecified: Secondary | ICD-10-CM | POA: Diagnosis not present

## 2020-04-06 DIAGNOSIS — N433 Hydrocele, unspecified: Secondary | ICD-10-CM | POA: Insufficient documentation

## 2020-04-06 DIAGNOSIS — M1990 Unspecified osteoarthritis, unspecified site: Secondary | ICD-10-CM | POA: Diagnosis not present

## 2020-04-06 DIAGNOSIS — H409 Unspecified glaucoma: Secondary | ICD-10-CM | POA: Diagnosis not present

## 2020-04-06 DIAGNOSIS — N503 Cyst of epididymis: Secondary | ICD-10-CM | POA: Diagnosis not present

## 2020-04-06 DIAGNOSIS — I10 Essential (primary) hypertension: Secondary | ICD-10-CM | POA: Diagnosis not present

## 2020-04-06 DIAGNOSIS — Z8546 Personal history of malignant neoplasm of prostate: Secondary | ICD-10-CM | POA: Insufficient documentation

## 2020-04-06 DIAGNOSIS — L821 Other seborrheic keratosis: Secondary | ICD-10-CM | POA: Diagnosis not present

## 2020-04-06 DIAGNOSIS — E785 Hyperlipidemia, unspecified: Secondary | ICD-10-CM | POA: Insufficient documentation

## 2020-04-06 HISTORY — PX: HYDROCELE EXCISION: SHX482

## 2020-04-06 HISTORY — DX: Other specified postprocedural states: Z98.890

## 2020-04-06 HISTORY — DX: Other specified disorders of the male genital organs: N50.89

## 2020-04-06 HISTORY — DX: Personal history of malignant neoplasm of prostate: Z85.46

## 2020-04-06 HISTORY — DX: Hydrocele, unspecified: N43.3

## 2020-04-06 HISTORY — DX: Personal history of other diseases of the digestive system: Z87.19

## 2020-04-06 HISTORY — DX: Obstructive sleep apnea (adult) (pediatric): G47.33

## 2020-04-06 HISTORY — DX: Other specified postprocedural states: R11.2

## 2020-04-06 HISTORY — DX: Presence of spectacles and contact lenses: Z97.3

## 2020-04-06 HISTORY — DX: Unspecified urinary incontinence: R32

## 2020-04-06 HISTORY — DX: Personal history of other diseases of the circulatory system: Z86.79

## 2020-04-06 HISTORY — DX: Anxiety disorder, unspecified: F41.9

## 2020-04-06 HISTORY — DX: Personal history of malignant melanoma of skin: Z85.820

## 2020-04-06 HISTORY — DX: Unspecified glaucoma: H40.9

## 2020-04-06 LAB — POCT I-STAT, CHEM 8
BUN: 24 mg/dL — ABNORMAL HIGH (ref 8–23)
Calcium, Ion: 1.28 mmol/L (ref 1.15–1.40)
Chloride: 105 mmol/L (ref 98–111)
Creatinine, Ser: 1 mg/dL (ref 0.61–1.24)
Glucose, Bld: 104 mg/dL — ABNORMAL HIGH (ref 70–99)
HCT: 48 % (ref 39.0–52.0)
Hemoglobin: 16.3 g/dL (ref 13.0–17.0)
Potassium: 4 mmol/L (ref 3.5–5.1)
Sodium: 140 mmol/L (ref 135–145)
TCO2: 27 mmol/L (ref 22–32)

## 2020-04-06 SURGERY — HYDROCELECTOMY
Anesthesia: General | Site: Scrotum

## 2020-04-06 MED ORDER — PHENYLEPHRINE HCL (PRESSORS) 10 MG/ML IV SOLN
INTRAVENOUS | Status: AC
  Filled 2020-04-06: qty 1

## 2020-04-06 MED ORDER — PHENYLEPHRINE 40 MCG/ML (10ML) SYRINGE FOR IV PUSH (FOR BLOOD PRESSURE SUPPORT)
PREFILLED_SYRINGE | INTRAVENOUS | Status: AC
  Filled 2020-04-06: qty 10

## 2020-04-06 MED ORDER — CEFAZOLIN SODIUM-DEXTROSE 2-4 GM/100ML-% IV SOLN
INTRAVENOUS | Status: AC
  Filled 2020-04-06: qty 100

## 2020-04-06 MED ORDER — ONDANSETRON HCL 4 MG/2ML IJ SOLN
INTRAMUSCULAR | Status: AC
  Filled 2020-04-06: qty 4

## 2020-04-06 MED ORDER — BUPIVACAINE HCL (PF) 0.25 % IJ SOLN
INTRAMUSCULAR | Status: DC | PRN
  Administered 2020-04-06: 7 mL

## 2020-04-06 MED ORDER — OXYCODONE HCL 5 MG PO TABS
5.0000 mg | ORAL_TABLET | Freq: Once | ORAL | Status: AC | PRN
  Administered 2020-04-06: 5 mg via ORAL

## 2020-04-06 MED ORDER — OXYCODONE HCL 5 MG/5ML PO SOLN: 5.0000 mg | Freq: Once | ORAL | Status: AC | PRN

## 2020-04-06 MED ORDER — ONDANSETRON HCL 4 MG/2ML IJ SOLN: 4.0000 mg | Freq: Once | INTRAMUSCULAR | Status: DC | PRN

## 2020-04-06 MED ORDER — EPHEDRINE SULFATE-NACL 50-0.9 MG/10ML-% IV SOSY
PREFILLED_SYRINGE | INTRAVENOUS | Status: DC | PRN
  Administered 2020-04-06 (×2): 10 mg via INTRAVENOUS

## 2020-04-06 MED ORDER — LIDOCAINE 2% (20 MG/ML) 5 ML SYRINGE
INTRAMUSCULAR | Status: AC
  Filled 2020-04-06: qty 20

## 2020-04-06 MED ORDER — ONDANSETRON HCL 4 MG/2ML IJ SOLN
INTRAMUSCULAR | Status: DC | PRN
  Administered 2020-04-06: 4 mg via INTRAVENOUS

## 2020-04-06 MED ORDER — FENTANYL CITRATE (PF) 100 MCG/2ML IJ SOLN
INTRAMUSCULAR | Status: AC
  Filled 2020-04-06: qty 2

## 2020-04-06 MED ORDER — PHENYLEPHRINE 40 MCG/ML (10ML) SYRINGE FOR IV PUSH (FOR BLOOD PRESSURE SUPPORT)
PREFILLED_SYRINGE | INTRAVENOUS | Status: DC | PRN
  Administered 2020-04-06 (×2): 80 ug via INTRAVENOUS
  Administered 2020-04-06: 200 ug via INTRAVENOUS
  Administered 2020-04-06: 80 ug via INTRAVENOUS

## 2020-04-06 MED ORDER — HYDROCODONE-ACETAMINOPHEN 5-325 MG PO TABS: 1.0000 | ORAL_TABLET | Freq: Four times a day (QID) | ORAL | 0 refills | Status: AC | PRN

## 2020-04-06 MED ORDER — SODIUM CHLORIDE 0.9 % IV SOLN: INTRAVENOUS | Status: DC | PRN

## 2020-04-06 MED ORDER — DEXAMETHASONE SODIUM PHOSPHATE 10 MG/ML IJ SOLN
INTRAMUSCULAR | Status: DC | PRN
  Administered 2020-04-06: 10 mg via INTRAVENOUS

## 2020-04-06 MED ORDER — OXYCODONE HCL 5 MG PO TABS
ORAL_TABLET | ORAL | Status: AC
  Filled 2020-04-06: qty 1

## 2020-04-06 MED ORDER — EPHEDRINE 5 MG/ML INJ
INTRAVENOUS | Status: AC
  Filled 2020-04-06: qty 10

## 2020-04-06 MED ORDER — CEFAZOLIN SODIUM-DEXTROSE 2-4 GM/100ML-% IV SOLN
2.0000 g | INTRAVENOUS | Status: AC
  Administered 2020-04-06: 2 g via INTRAVENOUS

## 2020-04-06 MED ORDER — PROPOFOL 10 MG/ML IV BOLUS
INTRAVENOUS | Status: DC | PRN
  Administered 2020-04-06: 150 mg via INTRAVENOUS

## 2020-04-06 MED ORDER — LACTATED RINGERS IV SOLN: INTRAVENOUS | Status: DC

## 2020-04-06 MED ORDER — PHENYLEPHRINE HCL-NACL 10-0.9 MG/250ML-% IV SOLN
INTRAVENOUS | Status: DC | PRN
  Administered 2020-04-06: 15 ug/min via INTRAVENOUS

## 2020-04-06 MED ORDER — FENTANYL CITRATE (PF) 100 MCG/2ML IJ SOLN
25.0000 ug | INTRAMUSCULAR | Status: DC | PRN
  Administered 2020-04-06: 100 ug via INTRAVENOUS

## 2020-04-06 MED ORDER — LIDOCAINE 2% (20 MG/ML) 5 ML SYRINGE
INTRAMUSCULAR | Status: DC | PRN
  Administered 2020-04-06: 60 mg via INTRAVENOUS

## 2020-04-06 MED ORDER — SODIUM CHLORIDE 0.9% FLUSH: 3.0000 mL | Freq: Two times a day (BID) | INTRAVENOUS | Status: DC

## 2020-04-06 MED ORDER — DEXAMETHASONE SODIUM PHOSPHATE 10 MG/ML IJ SOLN
INTRAMUSCULAR | Status: AC
  Filled 2020-04-06: qty 2

## 2020-04-06 MED ORDER — PROPOFOL 10 MG/ML IV BOLUS
INTRAVENOUS | Status: AC
  Filled 2020-04-06: qty 20

## 2020-04-06 SURGICAL SUPPLY — 37 items
BLADE CLIPPER SENSICLIP SURGIC (BLADE) ×2 IMPLANT
BLADE SURG 15 STRL LF DISP TIS (BLADE) ×1 IMPLANT
BLADE SURG 15 STRL SS (BLADE) ×2
BNDG GAUZE ELAST 4 BULKY (GAUZE/BANDAGES/DRESSINGS) ×2 IMPLANT
CANISTER SUCT 1200ML W/VALVE (MISCELLANEOUS) IMPLANT
CANISTER SUCT 3000ML PPV (MISCELLANEOUS) ×1 IMPLANT
CLEANER CAUTERY TIP 5X5 PAD (MISCELLANEOUS) IMPLANT
COVER BACK TABLE 60X90IN (DRAPES) ×2 IMPLANT
COVER MAYO STAND STRL (DRAPES) ×2 IMPLANT
COVER WAND RF STERILE (DRAPES) ×2 IMPLANT
DISSECTOR ROUND CHERRY 3/8 STR (MISCELLANEOUS) IMPLANT
DRAIN PENROSE 0.25X18 (DRAIN) IMPLANT
DRAPE LAPAROTOMY 100X72 PEDS (DRAPES) ×2 IMPLANT
ELECT REM PT RETURN 9FT ADLT (ELECTROSURGICAL) ×2
ELECTRODE REM PT RTRN 9FT ADLT (ELECTROSURGICAL) IMPLANT
GAUZE SPONGE 4X4 12PLY STRL LF (GAUZE/BANDAGES/DRESSINGS) ×1 IMPLANT
GLOVE SURG SS PI 8.0 STRL IVOR (GLOVE) ×2 IMPLANT
GOWN STRL REUS W/ TWL LRG LVL3 (GOWN DISPOSABLE) ×1 IMPLANT
GOWN STRL REUS W/ TWL XL LVL3 (GOWN DISPOSABLE) ×1 IMPLANT
GOWN STRL REUS W/TWL LRG LVL3 (GOWN DISPOSABLE) ×2
GOWN STRL REUS W/TWL XL LVL3 (GOWN DISPOSABLE) ×2
KIT TURNOVER CYSTO (KITS) ×2 IMPLANT
MANIFOLD NEPTUNE II (INSTRUMENTS) IMPLANT
NDL HYPO 25X1 1.5 SAFETY (NEEDLE) ×1 IMPLANT
NEEDLE HYPO 25X1 1.5 SAFETY (NEEDLE) ×2 IMPLANT
NS IRRIG 500ML POUR BTL (IV SOLUTION) ×2 IMPLANT
PAD CLEANER CAUTERY TIP 5X5 (MISCELLANEOUS)
PENCIL BUTTON HOLSTER BLD 10FT (ELECTRODE) ×2 IMPLANT
SET BASIN DAY SURGERY F.S. (CUSTOM PROCEDURE TRAY) ×2 IMPLANT
SUPPORT SCROTAL LG STRP (MISCELLANEOUS) ×2 IMPLANT
SUT CHROMIC 3 0 SH 27 (SUTURE) ×4 IMPLANT
SYR CONTROL 10ML LL (SYRINGE) ×2 IMPLANT
TOWEL OR 17X26 10 PK STRL BLUE (TOWEL DISPOSABLE) ×3 IMPLANT
TRAY DSU PREP LF (CUSTOM PROCEDURE TRAY) ×2 IMPLANT
TUBE CONNECTING 12X1/4 (SUCTIONS) ×2 IMPLANT
WATER STERILE IRR 500ML POUR (IV SOLUTION) ×1 IMPLANT
YANKAUER SUCT BULB TIP NO VENT (SUCTIONS) ×2 IMPLANT

## 2020-04-06 NOTE — Op Note (Signed)
Procedure: 1.  Right hydrocelectomy with right epididymectomy. 2.  Excisional biopsy of left scrotal nevus.  Preop diagnosis: 1. right hydrocele with epididymal mass.  Postop diagnosis: 1.  Right hydrocele with epididymal mass.  2.  Left scrotal nevus.  Surgeon: Dr. Irine Seal.  Anesthesia: General.  Specimen: 1.  Right tunica vaginalis and epididymis. 2.  Left scrotal nevus.  Drains: None.  EBL: 2 mL.  Complications: None.  Indications: The patient is a 74 year old male with a right hydrocele and a right lower pole epididymal mass approximately 2 to 3 cm in size.  He is to undergo hydrocelectomy and epididymectomy.  Procedure: He was given 2 g of Ancef.  He was taken operating room where a general anesthetic was induced.  He was fitted with PAS hose.  The scrotum was clipped and he was prepped with Betadine solution and draped in usual sterile fashion.  An oblique right anterior scrotal incision was made along the skin lines with a knife and the dartos was incised with the Bovie.  The testicle was delivered from the wound within the tunica vaginalis.  The tunics was opened and the hydrocele was drained.  The volume of the hydrocele was 125 mL.  The fluid was clear and golden.  The epididymis was then dissected away from the testicle primarily using the Bovie with care being taken to avoid the testicular blood supply.  There was a mass in the tail of the epididymis but it was not round and solid but more tubular and convoluted.  The epididymis was dissected up to the rete testis which was divided and then oversewn with 3-0 chromic sutures.  The vas was divided just distal to the convoluted portion and ligated with 3-0 chromic.  The hydrocele sac was excised with the epididymis.  A cord block was performed with 3 mL of quarter percent Marcaine.  Once hemostasis was assured the testicle was returned to the right hemiscrotum.  The dartos was closed using a running 3-0 chromic suture.  An  additional 4 mL of quarter percent Marcaine were infiltrated into the skin edges.  The skin was closed using a running vertical mattress suture.  He was noted to have approximately an 8 mm lesion on the left anterior scrotal wall it was most consistent with a nevus but it did have a somewhat irregular margin and it was felt that excision was appropriate.  The lesion was circumscribed with a knife and the Bovie was used to free it from the scrotal wall.  The wound was then closed with interrupted 3-0 chromic sutures.  The wounds were cleaned and dried and a dressing of 4 x 4's and fluff Kerlix was applied and secured with a scrotal support.  Counts were correct.  His anesthetic was reversed and he was moved to recovery in stable condition.  There were no complications.

## 2020-04-06 NOTE — Anesthesia Procedure Notes (Signed)
Date/Time: 04/06/2020 10:13 AM Performed by: Cynda Familia, CRNA Oxygen Delivery Method: Simple face mask Placement Confirmation: positive ETCO2 and breath sounds checked- equal and bilateral Dental Injury: Teeth and Oropharynx as per pre-operative assessment

## 2020-04-06 NOTE — Anesthesia Postprocedure Evaluation (Signed)
Anesthesia Post Note  Patient: LENORD HASHIMI  Procedure(s) Performed: RIGHT HYDROCELECTOMY ADULT AND EXCISION OF LEFT EPIDIDYMAL MASS (N/A Scrotum)     Patient location during evaluation: PACU Anesthesia Type: General Level of consciousness: awake and alert and oriented Pain management: pain level controlled Vital Signs Assessment: post-procedure vital signs reviewed and stable Respiratory status: spontaneous breathing, nonlabored ventilation and respiratory function stable Cardiovascular status: blood pressure returned to baseline and stable Postop Assessment: no apparent nausea or vomiting Anesthetic complications: no    Last Vitals:  Vitals:   04/06/20 1045 04/06/20 1100  BP: 128/78 106/72  Pulse: 71 74  Resp: 16 11  Temp:    SpO2: 91% 94%    Last Pain:  Vitals:   04/06/20 1115  TempSrc:   PainSc: 3                  Therese Rocco A.

## 2020-04-06 NOTE — Anesthesia Procedure Notes (Signed)
Procedure Name: LMA Insertion Date/Time: 04/06/2020 9:21 AM Performed by: Myna Bright, CRNA Pre-anesthesia Checklist: Patient identified, Emergency Drugs available, Suction available and Patient being monitored Patient Re-evaluated:Patient Re-evaluated prior to induction Oxygen Delivery Method: Circle system utilized Preoxygenation: Pre-oxygenation with 100% oxygen Induction Type: IV induction Ventilation: Mask ventilation without difficulty LMA: LMA inserted LMA Size: 4.0 Number of attempts: 1 Placement Confirmation: positive ETCO2 and breath sounds checked- equal and bilateral Tube secured with: Tape Dental Injury: Teeth and Oropharynx as per pre-operative assessment

## 2020-04-06 NOTE — Anesthesia Preprocedure Evaluation (Addendum)
Anesthesia Evaluation  Patient identified by MRN, date of birth, ID band Patient awake    Reviewed: Allergy & Precautions, NPO status , Patient's Chart, lab work & pertinent test results, reviewed documented beta blocker date and time   Airway Mallampati: III  TM Distance: >3 FB Neck ROM: Full    Dental no notable dental hx. (+) Teeth Intact, Caps   Pulmonary sleep apnea and Continuous Positive Airway Pressure Ventilation , former smoker,    Pulmonary exam normal breath sounds clear to auscultation       Cardiovascular hypertension, Pt. on medications Normal cardiovascular exam+ dysrhythmias Supra Ventricular Tachycardia  Rhythm:Regular Rate:Normal     Neuro/Psych Anxiety Glaucoma OU    GI/Hepatic Neg liver ROS, GERD  ,  Endo/Other  Obesity Hyperlipidemia  Renal/GU negative Renal ROS   Hx/o prostate Ca S/P Prostatectomy Urinary incontinence sine Prostatectomy Right hydrocele and epididymal mass    Musculoskeletal  (+) Arthritis , Osteoarthritis,    Abdominal (+) + obese,   Peds  Hematology negative hematology ROS (+)   Anesthesia Other Findings   Reproductive/Obstetrics                            Anesthesia Physical Anesthesia Plan  ASA: II  Anesthesia Plan: General   Post-op Pain Management:    Induction: Intravenous  PONV Risk Score and Plan: 3 and Ondansetron, Dexamethasone and Treatment may vary due to age or medical condition  Airway Management Planned: LMA  Additional Equipment:   Intra-op Plan:   Post-operative Plan: Extubation in OR  Informed Consent: I have reviewed the patients History and Physical, chart, labs and discussed the procedure including the risks, benefits and alternatives for the proposed anesthesia with the patient or authorized representative who has indicated his/her understanding and acceptance.     Dental advisory given  Plan Discussed with:  CRNA and Surgeon  Anesthesia Plan Comments:         Anesthesia Quick Evaluation

## 2020-04-06 NOTE — Transfer of Care (Signed)
Immediate Anesthesia Transfer of Care Note  Patient: Anthony Glenn  Procedure(s) Performed: RIGHT HYDROCELECTOMY ADULT AND EXCISION OF LEFT EPIDIDYMAL MASS (N/A Scrotum)  Patient Location: PACU  Anesthesia Type:General  Level of Consciousness: awake and alert   Airway & Oxygen Therapy: Patient Spontanous Breathing and Patient connected to face mask oxygen  Post-op Assessment: Report given to RN and Post -op Vital signs reviewed and stable  Post vital signs: Reviewed and stable  Last Vitals:  Vitals Value Taken Time  BP 106/72 04/06/20 1100  Temp 36.4 C 04/06/20 1020  Pulse 69 04/06/20 1102  Resp 15 04/06/20 1102  SpO2 96 % 04/06/20 1102  Vitals shown include unvalidated device data.  Last Pain:  Vitals:   04/06/20 1100  TempSrc:   PainSc: 0-No pain      Patients Stated Pain Goal: 4 ([id number])  Complications: No apparent anesthesia complications

## 2020-04-06 NOTE — Discharge Instructions (Addendum)
Current Urology, 10(1), 1-14. https://doi.[email or web address]">  Hydrocelectomy, Adult, Care After This sheet gives you information about how to care for yourself after your procedure. Your health care provider may also give you more specific instructions. If you have problems or questions, contact your health care provider. What can I expect after the procedure? After your procedure, it is common to have:  Mild discomfort and swelling in the pouch that holds your testicles (scrotum).  Bruising of the scrotum. Follow these instructions at home: Medicines  Take over-the-counter and prescription medicines only as told by your health care provider.  Ask your health care provider if the medicine prescribed to you: ? Requires you to avoid driving or using heavy machinery. ? Can cause constipation. You may need to take these actions to prevent or treat constipation:  Drink enough fluid to keep your urine pale yellow.  Take over-the-counter or prescription medicines.  Eat foods that are high in fiber, such as beans, whole grains, and fresh fruits and vegetables.  Limit foods that are high in fat and processed sugars, such as fried or sweet foods. Bathing  Do not take baths, swim, or use a hot tub until your health care provider approves. Ask your health care provider if you may take showers. You may only be allowed to take sponge baths.  If you were told to wear an athletic support strap (scrotal support), keep it dry. Take it off when you shower or bathe. Incision care   Follow instructions from your health care provider about how to take care of your incision. Make sure you: ? Wash your hands with soap and water before and after you change your bandage (dressing). If soap and water are not available, use hand sanitizer. ? Change your dressing as told by your health care provider. ? Leave stitches (sutures), skin glue, or adhesive strips in place. These skin closures may need to stay  in place for 2 weeks or longer. If adhesive strip edges start to loosen and curl up, you may trim the loose edges. Do not remove adhesive strips completely unless your health care provider tells you to do that.  Check your incision and scrotum every day for signs of infection. Check for: ? More redness, swelling, or pain. ? Fluid or blood. ? Warmth. ? Pus or a bad smell. Managing pain and swelling If directed, put ice on the affected area. To do this:  Put ice in a plastic bag.  Place a towel between your skin and the bag.  Leave the ice on for 20 minutes, 2-3 times per day.  Activity  Do not do any high-energy activities for as long as told by your health care provider.  Do not lift anything that is heavier than 10 lb (4.5 kg), or the limit that you are told, until your health care provider says that it is safe.  Return to your normal activities as told by your health care provider. Ask your health care provider what activities are safe for you.  Do not drive for 24 hours if you were given a sedative during your procedure.  Ask your health care provider when it is safe to drive. General instructions  Do not use any products that contain nicotine or tobacco, such as cigarettes, e-cigarettes, and chewing tobacco. These can delay incision healing after surgery. If you need help quitting, ask your health care provider.  If you were given a scrotal support, wear it as told by your health care provider.  If you   had a drain put in during the procedure, you will need to have it removed at a follow-up visit.  Keep all follow-up visits as told by your health care provider. This is important. Contact a health care provider if:  Your pain is not controlled with medicine.  You have more redness, swelling, or pain around your scrotum.  You have fluid or blood coming from your incision.  Your incision feels warm to the touch.  You have pus or a bad smell coming from your  scrotum.  You have a fever. Get help right away if:  You develop shaking, chills, and a fever that is higher than 101.46F (38.8C).  You have redness or swelling that starts at your scrotum and spreads outward to cover your whole groin.  You develop swelling of the legs or difficulty breathing. Summary  After a hydrocelectomy, it is common to have mild discomfort, swelling, and bruising.  Do not take baths, swim, or use a hot tub until your health care provider approves. Ask your health care provider if you may take showers.  If directed, put ice on the affected area to help with pain and swelling.  Do not do any high-energy activities or lift anything heavier than 10 lb (4.5 kg) for as long as told by your health care provider.  If you were given a scrotal support, keep it dry. Wear the scrotal support as told by your health care provider.  You had an abnormal looking mole on the left side of the scrotum.  I removed that and sent it to the lab.   This information is not intended to replace advice given to you by your health care provider. Make sure you discuss any questions you have with your health care provider. Document Revised: 03/30/2019 Document Reviewed: 03/30/2019 Elsevier Patient Education  [place] Instructions  Activity: Get plenty of rest for the remainder of the day. A responsible adult should stay with you for 24 hours following the procedure.  For the next 24 hours, DO NOT: -Drive a car -Paediatric nurse -Drink alcoholic beverages -Take any medication unless instructed by your physician -Make any legal decisions or sign important papers.  Meals: Start with liquid foods such as gelatin or soup. Progress to regular foods as tolerated. Avoid greasy, spicy, heavy foods. If nausea and/or vomiting occur, drink only clear liquids until the nausea and/or vomiting subsides. Call your physician if vomiting continues.  Special  Instructions/Symptoms: Your throat may feel dry or sore from the anesthesia or the breathing tube placed in your throat during surgery. If this causes discomfort, gargle with warm salt water. The discomfort should disappear within 24 hours.  If you had a scopolamine patch placed behind your ear for the management of post- operative nausea and/or vomiting:  1. The medication in the patch is effective for 72 hours, after which it should be removed.  Wrap patch in a tissue and discard in the trash. Wash hands thoroughly with soap and water. 2. You may remove the patch earlier than 72 hours if you experience unpleasant side effects which may include dry mouth, dizziness or visual disturbances. 3. Avoid touching the patch. Wash your hands with soap and water after contact with the patch.

## 2020-04-06 NOTE — Interval H&P Note (Signed)
History and Physical Interval Note:  04/06/2020 7:21 AM  Anthony Glenn  has presented today for surgery, with the diagnosis of RIGHT HYDROCELE AND EPIDIDYMAL MASS.  The various methods of treatment have been discussed with the patient and family. After consideration of risks, benefits and other options for treatment, the patient has consented to  Procedure(s): HYDROCELECTOMY ADULT AND EXCISION OF EPIDIDYMAL MASS (N/A) as a surgical intervention.  The patient's history has been reviewed, patient examined, no change in status, stable for surgery.  I have reviewed the patient's chart and labs.  Questions were answered to the patient's satisfaction.     Irine Seal

## 2020-04-09 LAB — SURGICAL PATHOLOGY

## 2020-11-21 DIAGNOSIS — M6289 Other specified disorders of muscle: Secondary | ICD-10-CM | POA: Diagnosis not present

## 2020-11-21 DIAGNOSIS — M62838 Other muscle spasm: Secondary | ICD-10-CM | POA: Diagnosis not present

## 2020-11-21 DIAGNOSIS — M6281 Muscle weakness (generalized): Secondary | ICD-10-CM | POA: Diagnosis not present

## 2020-11-24 DIAGNOSIS — H5203 Hypermetropia, bilateral: Secondary | ICD-10-CM | POA: Diagnosis not present

## 2020-11-24 DIAGNOSIS — H2513 Age-related nuclear cataract, bilateral: Secondary | ICD-10-CM | POA: Diagnosis not present

## 2020-11-24 DIAGNOSIS — H401131 Primary open-angle glaucoma, bilateral, mild stage: Secondary | ICD-10-CM | POA: Diagnosis not present

## 2020-12-07 DIAGNOSIS — Z01812 Encounter for preprocedural laboratory examination: Secondary | ICD-10-CM | POA: Diagnosis not present

## 2020-12-07 DIAGNOSIS — R278 Other lack of coordination: Secondary | ICD-10-CM | POA: Diagnosis not present

## 2020-12-07 DIAGNOSIS — M6281 Muscle weakness (generalized): Secondary | ICD-10-CM | POA: Diagnosis not present

## 2020-12-12 DIAGNOSIS — Z86010 Personal history of colonic polyps: Secondary | ICD-10-CM | POA: Diagnosis not present

## 2020-12-12 DIAGNOSIS — K5730 Diverticulosis of large intestine without perforation or abscess without bleeding: Secondary | ICD-10-CM | POA: Diagnosis not present

## 2020-12-12 DIAGNOSIS — K648 Other hemorrhoids: Secondary | ICD-10-CM | POA: Diagnosis not present

## 2020-12-12 DIAGNOSIS — D120 Benign neoplasm of cecum: Secondary | ICD-10-CM | POA: Diagnosis not present

## 2020-12-14 DIAGNOSIS — D120 Benign neoplasm of cecum: Secondary | ICD-10-CM | POA: Diagnosis not present

## 2020-12-26 DIAGNOSIS — M6289 Other specified disorders of muscle: Secondary | ICD-10-CM | POA: Diagnosis not present

## 2020-12-26 DIAGNOSIS — M6281 Muscle weakness (generalized): Secondary | ICD-10-CM | POA: Diagnosis not present

## 2020-12-26 DIAGNOSIS — M62838 Other muscle spasm: Secondary | ICD-10-CM | POA: Diagnosis not present

## 2021-01-03 DIAGNOSIS — I10 Essential (primary) hypertension: Secondary | ICD-10-CM | POA: Diagnosis not present

## 2021-01-03 DIAGNOSIS — E7800 Pure hypercholesterolemia, unspecified: Secondary | ICD-10-CM | POA: Diagnosis not present

## 2021-01-16 DIAGNOSIS — M6281 Muscle weakness (generalized): Secondary | ICD-10-CM | POA: Diagnosis not present

## 2021-01-16 DIAGNOSIS — M62838 Other muscle spasm: Secondary | ICD-10-CM | POA: Diagnosis not present

## 2021-01-16 DIAGNOSIS — R278 Other lack of coordination: Secondary | ICD-10-CM | POA: Diagnosis not present

## 2021-01-22 DIAGNOSIS — I10 Essential (primary) hypertension: Secondary | ICD-10-CM | POA: Diagnosis not present

## 2021-01-22 DIAGNOSIS — E7800 Pure hypercholesterolemia, unspecified: Secondary | ICD-10-CM | POA: Diagnosis not present

## 2021-02-24 DIAGNOSIS — I10 Essential (primary) hypertension: Secondary | ICD-10-CM | POA: Diagnosis not present

## 2021-02-24 DIAGNOSIS — E7800 Pure hypercholesterolemia, unspecified: Secondary | ICD-10-CM | POA: Diagnosis not present

## 2021-03-22 DIAGNOSIS — I10 Essential (primary) hypertension: Secondary | ICD-10-CM | POA: Diagnosis not present

## 2021-03-22 DIAGNOSIS — E7800 Pure hypercholesterolemia, unspecified: Secondary | ICD-10-CM | POA: Diagnosis not present

## 2021-03-22 DIAGNOSIS — Z79899 Other long term (current) drug therapy: Secondary | ICD-10-CM | POA: Diagnosis not present

## 2021-04-23 DIAGNOSIS — E7800 Pure hypercholesterolemia, unspecified: Secondary | ICD-10-CM | POA: Diagnosis not present

## 2021-04-23 DIAGNOSIS — I10 Essential (primary) hypertension: Secondary | ICD-10-CM | POA: Diagnosis not present

## 2021-05-30 DIAGNOSIS — I10 Essential (primary) hypertension: Secondary | ICD-10-CM | POA: Diagnosis not present

## 2021-05-30 DIAGNOSIS — E7800 Pure hypercholesterolemia, unspecified: Secondary | ICD-10-CM | POA: Diagnosis not present

## 2021-06-11 DIAGNOSIS — H401131 Primary open-angle glaucoma, bilateral, mild stage: Secondary | ICD-10-CM | POA: Diagnosis not present

## 2021-07-17 DIAGNOSIS — E7800 Pure hypercholesterolemia, unspecified: Secondary | ICD-10-CM | POA: Diagnosis not present

## 2021-07-17 DIAGNOSIS — I10 Essential (primary) hypertension: Secondary | ICD-10-CM | POA: Diagnosis not present

## 2021-09-18 DIAGNOSIS — D1801 Hemangioma of skin and subcutaneous tissue: Secondary | ICD-10-CM | POA: Diagnosis not present

## 2021-09-18 DIAGNOSIS — L814 Other melanin hyperpigmentation: Secondary | ICD-10-CM | POA: Diagnosis not present

## 2021-09-18 DIAGNOSIS — L910 Hypertrophic scar: Secondary | ICD-10-CM | POA: Diagnosis not present

## 2021-09-18 DIAGNOSIS — D225 Melanocytic nevi of trunk: Secondary | ICD-10-CM | POA: Diagnosis not present

## 2021-09-18 DIAGNOSIS — L570 Actinic keratosis: Secondary | ICD-10-CM | POA: Diagnosis not present

## 2021-09-18 DIAGNOSIS — L821 Other seborrheic keratosis: Secondary | ICD-10-CM | POA: Diagnosis not present

## 2021-09-18 DIAGNOSIS — Z85820 Personal history of malignant melanoma of skin: Secondary | ICD-10-CM | POA: Diagnosis not present

## 2021-10-03 DIAGNOSIS — E7800 Pure hypercholesterolemia, unspecified: Secondary | ICD-10-CM | POA: Diagnosis not present

## 2021-10-03 DIAGNOSIS — Z0000 Encounter for general adult medical examination without abnormal findings: Secondary | ICD-10-CM | POA: Diagnosis not present

## 2021-10-03 DIAGNOSIS — Z79899 Other long term (current) drug therapy: Secondary | ICD-10-CM | POA: Diagnosis not present

## 2021-10-03 DIAGNOSIS — Z23 Encounter for immunization: Secondary | ICD-10-CM | POA: Diagnosis not present

## 2021-10-03 DIAGNOSIS — I10 Essential (primary) hypertension: Secondary | ICD-10-CM | POA: Diagnosis not present

## 2021-10-03 DIAGNOSIS — Z1389 Encounter for screening for other disorder: Secondary | ICD-10-CM | POA: Diagnosis not present

## 2021-10-15 DIAGNOSIS — E7800 Pure hypercholesterolemia, unspecified: Secondary | ICD-10-CM | POA: Diagnosis not present

## 2021-10-15 DIAGNOSIS — I10 Essential (primary) hypertension: Secondary | ICD-10-CM | POA: Diagnosis not present

## 2021-11-27 DIAGNOSIS — H401131 Primary open-angle glaucoma, bilateral, mild stage: Secondary | ICD-10-CM | POA: Diagnosis not present

## 2021-11-27 DIAGNOSIS — H524 Presbyopia: Secondary | ICD-10-CM | POA: Diagnosis not present

## 2022-03-20 DIAGNOSIS — I10 Essential (primary) hypertension: Secondary | ICD-10-CM | POA: Diagnosis not present

## 2022-03-20 DIAGNOSIS — Z79899 Other long term (current) drug therapy: Secondary | ICD-10-CM | POA: Diagnosis not present

## 2022-10-01 DIAGNOSIS — D225 Melanocytic nevi of trunk: Secondary | ICD-10-CM | POA: Diagnosis not present

## 2022-10-01 DIAGNOSIS — L814 Other melanin hyperpigmentation: Secondary | ICD-10-CM | POA: Diagnosis not present

## 2022-10-01 DIAGNOSIS — Z85820 Personal history of malignant melanoma of skin: Secondary | ICD-10-CM | POA: Diagnosis not present

## 2022-10-01 DIAGNOSIS — L570 Actinic keratosis: Secondary | ICD-10-CM | POA: Diagnosis not present

## 2022-10-01 DIAGNOSIS — L910 Hypertrophic scar: Secondary | ICD-10-CM | POA: Diagnosis not present

## 2022-10-01 DIAGNOSIS — B354 Tinea corporis: Secondary | ICD-10-CM | POA: Diagnosis not present

## 2022-10-23 DIAGNOSIS — H2513 Age-related nuclear cataract, bilateral: Secondary | ICD-10-CM | POA: Diagnosis not present

## 2022-11-08 DIAGNOSIS — E7800 Pure hypercholesterolemia, unspecified: Secondary | ICD-10-CM | POA: Diagnosis not present

## 2022-11-08 DIAGNOSIS — Z23 Encounter for immunization: Secondary | ICD-10-CM | POA: Diagnosis not present

## 2022-11-08 DIAGNOSIS — Z0000 Encounter for general adult medical examination without abnormal findings: Secondary | ICD-10-CM | POA: Diagnosis not present

## 2022-11-08 DIAGNOSIS — Z79899 Other long term (current) drug therapy: Secondary | ICD-10-CM | POA: Diagnosis not present

## 2022-11-08 DIAGNOSIS — I10 Essential (primary) hypertension: Secondary | ICD-10-CM | POA: Diagnosis not present

## 2023-02-21 DIAGNOSIS — H52223 Regular astigmatism, bilateral: Secondary | ICD-10-CM | POA: Diagnosis not present

## 2023-02-21 DIAGNOSIS — H524 Presbyopia: Secondary | ICD-10-CM | POA: Diagnosis not present

## 2023-02-21 DIAGNOSIS — H401131 Primary open-angle glaucoma, bilateral, mild stage: Secondary | ICD-10-CM | POA: Diagnosis not present

## 2023-05-07 DIAGNOSIS — I10 Essential (primary) hypertension: Secondary | ICD-10-CM | POA: Diagnosis not present

## 2023-05-07 DIAGNOSIS — E7800 Pure hypercholesterolemia, unspecified: Secondary | ICD-10-CM | POA: Diagnosis not present

## 2023-07-01 ENCOUNTER — Other Ambulatory Visit: Payer: Self-pay

## 2023-07-01 ENCOUNTER — Observation Stay (HOSPITAL_BASED_OUTPATIENT_CLINIC_OR_DEPARTMENT_OTHER): Payer: Medicare Other

## 2023-07-01 ENCOUNTER — Other Ambulatory Visit (HOSPITAL_COMMUNITY): Payer: Medicare Other

## 2023-07-01 ENCOUNTER — Observation Stay (HOSPITAL_COMMUNITY): Payer: Medicare Other

## 2023-07-01 ENCOUNTER — Observation Stay (HOSPITAL_COMMUNITY)
Admission: EM | Admit: 2023-07-01 | Discharge: 2023-07-02 | Disposition: A | Discharge status: Home / Self Care | Payer: Medicare Other | Attending: Internal Medicine | Admitting: Internal Medicine

## 2023-07-01 ENCOUNTER — Emergency Department (HOSPITAL_COMMUNITY): Payer: Medicare Other

## 2023-07-01 ENCOUNTER — Encounter (HOSPITAL_COMMUNITY): Payer: Self-pay | Admitting: Emergency Medicine

## 2023-07-01 DIAGNOSIS — E872 Acidosis, unspecified: Secondary | ICD-10-CM | POA: Diagnosis present

## 2023-07-01 DIAGNOSIS — I2510 Atherosclerotic heart disease of native coronary artery without angina pectoris: Secondary | ICD-10-CM

## 2023-07-01 DIAGNOSIS — R778 Other specified abnormalities of plasma proteins: Secondary | ICD-10-CM | POA: Diagnosis not present

## 2023-07-01 DIAGNOSIS — R0602 Shortness of breath: Secondary | ICD-10-CM | POA: Insufficient documentation

## 2023-07-01 DIAGNOSIS — Z79899 Other long term (current) drug therapy: Secondary | ICD-10-CM | POA: Insufficient documentation

## 2023-07-01 DIAGNOSIS — E6609 Other obesity due to excess calories: Secondary | ICD-10-CM

## 2023-07-01 DIAGNOSIS — I10 Essential (primary) hypertension: Secondary | ICD-10-CM | POA: Diagnosis present

## 2023-07-01 DIAGNOSIS — Z8546 Personal history of malignant neoplasm of prostate: Secondary | ICD-10-CM | POA: Diagnosis not present

## 2023-07-01 DIAGNOSIS — R7989 Other specified abnormal findings of blood chemistry: Secondary | ICD-10-CM | POA: Diagnosis not present

## 2023-07-01 DIAGNOSIS — D72829 Elevated white blood cell count, unspecified: Secondary | ICD-10-CM | POA: Diagnosis not present

## 2023-07-01 DIAGNOSIS — R000 Tachycardia, unspecified: Principal | ICD-10-CM | POA: Diagnosis present

## 2023-07-01 DIAGNOSIS — R002 Palpitations: Secondary | ICD-10-CM | POA: Insufficient documentation

## 2023-07-01 DIAGNOSIS — Z1152 Encounter for screening for COVID-19: Secondary | ICD-10-CM | POA: Diagnosis not present

## 2023-07-01 DIAGNOSIS — Z87891 Personal history of nicotine dependence: Secondary | ICD-10-CM | POA: Insufficient documentation

## 2023-07-01 DIAGNOSIS — R7889 Finding of other specified substances, not normally found in blood: Secondary | ICD-10-CM

## 2023-07-01 DIAGNOSIS — E785 Hyperlipidemia, unspecified: Secondary | ICD-10-CM | POA: Diagnosis not present

## 2023-07-01 DIAGNOSIS — Z6835 Body mass index (BMI) 35.0-35.9, adult: Secondary | ICD-10-CM

## 2023-07-01 DIAGNOSIS — E669 Obesity, unspecified: Secondary | ICD-10-CM | POA: Diagnosis present

## 2023-07-01 DIAGNOSIS — N179 Acute kidney failure, unspecified: Secondary | ICD-10-CM | POA: Diagnosis present

## 2023-07-01 DIAGNOSIS — R739 Hyperglycemia, unspecified: Secondary | ICD-10-CM | POA: Diagnosis not present

## 2023-07-01 LAB — LIPID PANEL
Cholesterol: 121 mg/dL (ref 0–200)
HDL: 30 mg/dL — ABNORMAL LOW (ref >40)
LDL Cholesterol: 71 mg/dL (ref 0–99)
Total CHOL/HDL Ratio: 4 RATIO
Triglycerides: 101 mg/dL (ref <150)
VLDL: 20 mg/dL (ref 0–40)

## 2023-07-01 LAB — BASIC METABOLIC PANEL
Anion gap: 14 (ref 5–15)
BUN: 38 mg/dL — ABNORMAL HIGH (ref 8–23)
CO2: 19 mmol/L — ABNORMAL LOW (ref 22–32)
Calcium: 9 mg/dL (ref 8.9–10.3)
Chloride: 104 mmol/L (ref 98–111)
Creatinine, Ser: 1.79 mg/dL — ABNORMAL HIGH (ref 0.61–1.24)
GFR, Estimated: 38 mL/min — ABNORMAL LOW (ref >60)
Glucose, Bld: 173 mg/dL — ABNORMAL HIGH (ref 70–99)
Potassium: 3.9 mmol/L (ref 3.5–5.1)
Sodium: 137 mmol/L (ref 135–145)

## 2023-07-01 LAB — RESP PANEL BY RT-PCR (RSV, FLU A&B, COVID)  RVPGX2
Influenza A by PCR: NEGATIVE
Influenza B by PCR: NEGATIVE
Resp Syncytial Virus by PCR: NEGATIVE
SARS Coronavirus 2 by RT PCR: NEGATIVE

## 2023-07-01 LAB — RESPIRATORY PANEL BY PCR

## 2023-07-01 LAB — CBC
HCT: 47.9 % (ref 39.0–52.0)
Hemoglobin: 15.7 g/dL (ref 13.0–17.0)
MCH: 30.3 pg (ref 26.0–34.0)
MCHC: 32.8 g/dL (ref 30.0–36.0)
MCV: 92.3 fL (ref 80.0–100.0)
Platelets: 303 10*3/uL (ref 150–400)
RBC: 5.19 MIL/uL (ref 4.22–5.81)
RDW: 12.5 % (ref 11.5–15.5)
WBC: 11.5 10*3/uL — ABNORMAL HIGH (ref 4.0–10.5)
nRBC: 0 % (ref 0.0–0.2)

## 2023-07-01 LAB — I-STAT CG4 LACTIC ACID, ED: Lactic Acid, Venous: 2 mmol/L (ref 0.5–1.9)

## 2023-07-01 LAB — MAGNESIUM: Magnesium: 2 mg/dL (ref 1.7–2.4)

## 2023-07-01 LAB — ECHOCARDIOGRAM COMPLETE
Area-P 1/2: 2.29 cm2
Calc EF: 59.3 %
Height: 72 in
S' Lateral: 3.3 cm
Single Plane A2C EF: 64 %
Single Plane A4C EF: 59.5 %
Weight: 4240 oz

## 2023-07-01 LAB — TROPONIN I (HIGH SENSITIVITY)
Troponin I (High Sensitivity): 37 ng/L — ABNORMAL HIGH (ref <18)
Troponin I (High Sensitivity): 41 ng/L — ABNORMAL HIGH (ref <18)
Troponin I (High Sensitivity): 20 ng/L — ABNORMAL HIGH (ref <18)

## 2023-07-01 LAB — D-DIMER, QUANTITATIVE: D-Dimer, Quant: 0.53 ug/mL-FEU — ABNORMAL HIGH (ref 0.00–0.50)

## 2023-07-01 LAB — TSH: TSH: 1.935 u[IU]/mL (ref 0.350–4.500)

## 2023-07-01 LAB — CK: Total CK: 294 U/L (ref 49–397)

## 2023-07-01 MED ORDER — ONDANSETRON HCL 4 MG PO TABS: 4.0000 mg | ORAL_TABLET | Freq: Four times a day (QID) | ORAL | Status: DC | PRN

## 2023-07-01 MED ORDER — SODIUM CHLORIDE 0.9% FLUSH
3.0000 mL | Freq: Two times a day (BID) | INTRAVENOUS | Status: DC
  Administered 2023-07-01 (×2): 3 mL via INTRAVENOUS

## 2023-07-01 MED ORDER — METOPROLOL SUCCINATE ER 25 MG PO TB24
25.0000 mg | ORAL_TABLET | Freq: Every day | ORAL | Status: DC
  Administered 2023-07-01 – 2023-07-02 (×2): 25 mg via ORAL
  Filled 2023-07-01 (×2): qty 1

## 2023-07-01 MED ORDER — ENOXAPARIN SODIUM 40 MG/0.4ML IJ SOSY
40.0000 mg | PREFILLED_SYRINGE | Freq: Every day | INTRAMUSCULAR | Status: DC
  Filled 2023-07-01: qty 0.4

## 2023-07-01 MED ORDER — ALBUTEROL SULFATE (2.5 MG/3ML) 0.083% IN NEBU: 2.5000 mg | INHALATION_SOLUTION | Freq: Four times a day (QID) | RESPIRATORY_TRACT | Status: DC | PRN

## 2023-07-01 MED ORDER — SODIUM CHLORIDE 0.9 % IV SOLN: INTRAVENOUS | Status: DC

## 2023-07-01 MED ORDER — ACETAMINOPHEN 325 MG PO TABS: 650.0000 mg | ORAL_TABLET | Freq: Four times a day (QID) | ORAL | Status: DC | PRN

## 2023-07-01 MED ORDER — SODIUM CHLORIDE 0.9 % IV BOLUS
1000.0000 mL | Freq: Once | INTRAVENOUS | Status: AC
  Administered 2023-07-01: 1000 mL via INTRAVENOUS

## 2023-07-01 MED ORDER — PERFLUTREN LIPID MICROSPHERE
1.0000 mL | INTRAVENOUS | Status: AC | PRN
  Administered 2023-07-01: 6 mL via INTRAVENOUS

## 2023-07-01 MED ORDER — ONDANSETRON HCL 4 MG/2ML IJ SOLN: 4.0000 mg | Freq: Four times a day (QID) | INTRAMUSCULAR | Status: DC | PRN

## 2023-07-01 MED ORDER — ATORVASTATIN CALCIUM 10 MG PO TABS
10.0000 mg | ORAL_TABLET | Freq: Every day | ORAL | Status: DC
  Administered 2023-07-02: 10 mg via ORAL
  Filled 2023-07-01: qty 1

## 2023-07-01 MED ORDER — ACETAMINOPHEN 650 MG RE SUPP: 650.0000 mg | Freq: Four times a day (QID) | RECTAL | Status: DC | PRN

## 2023-07-01 NOTE — ED Provider Notes (Signed)
[facility] AT [facility] Provider Note   CSN: [id number] Arrival date & time: 07/01/23  0057     History  Chief Complaint  Patient presents with   Tachycardia    Anthony Glenn is a 77 y.o. male.  Presents to the emergency department for evaluation of elevated heart rate.  Patient reports that his Fitbit watch alerted him to tachycardia, heart rate around 120s.  He reports that he has a history of anxiety attacks and his heart rate does go up at times like this.  No known history of arrhythmia.  He has not been experiencing any chest pain associated with the symptoms.       Home Medications Prior to Admission medications   Medication Sig Start Date End Date Taking? Authorizing Provider  atorvastatin (LIPITOR) 10 MG tablet Take 10 mg by mouth daily.    [provider]  dorzolamide-timolol (COSOPT) 22.3-6.8 MG/ML ophthalmic solution Place 1 drop into both eyes daily.     [provider]  latanoprost (XALATAN) 0.005 % ophthalmic solution Place 1 drop into both eyes at bedtime.     [provider]  lisinopril-hydrochlorothiazide (PRINZIDE,ZESTORETIC) 20-12.5 MG per tablet Take 1 tablet by mouth every morning.     [provider]      Allergies    Patient has no known allergies.    Review of Systems   Review of Systems  Physical Exam Updated Vital Signs BP (!) 142/96   Pulse 74   Temp 98.6 F (37 C)   Resp 19   Ht 6' (1.829 m)   Wt 120.2 kg   SpO2 97%   BMI 35.94 kg/m  Physical Exam Vitals and nursing note reviewed.  Constitutional:      General: He is not in acute distress.    Appearance: He is well-developed.  HENT:     Head: Normocephalic and atraumatic.     Mouth/Throat:     Mouth: Mucous membranes are moist.  Eyes:     General: Vision grossly intact. Gaze aligned appropriately.     Extraocular Movements: Extraocular movements intact.     Conjunctiva/sclera: Conjunctivae normal.   Cardiovascular:     Rate and Rhythm: Normal rate and regular rhythm.     Pulses: Normal pulses.     Heart sounds: Normal heart sounds, S1 normal and S2 normal. No murmur heard.    No friction rub. No gallop.  Pulmonary:     Effort: Pulmonary effort is normal. No respiratory distress.     Breath sounds: Normal breath sounds.  Abdominal:     Palpations: Abdomen is soft.     Tenderness: There is no abdominal tenderness. There is no guarding or rebound.     Hernia: No hernia is present.  Musculoskeletal:        General: No swelling.     Cervical back: Full passive range of motion without pain, normal range of motion and neck supple. No pain with movement, spinous process tenderness or muscular tenderness. Normal range of motion.     Right lower leg: No edema.     Left lower leg: No edema.  Skin:    General: Skin is warm and dry.     Capillary Refill: Capillary refill takes less than 2 seconds.     Findings: No ecchymosis, erythema, lesion or wound.  Neurological:     Mental Status: He is alert and oriented to person, place, and time.     GCS: GCS eye  subscore is 4. GCS verbal subscore is 5. GCS motor subscore is 6.     Cranial Nerves: Cranial nerves 2-12 are intact.     Sensory: Sensation is intact.     Motor: Motor function is intact. No weakness or abnormal muscle tone.     Coordination: Coordination is intact.  Psychiatric:        Mood and Affect: Mood normal.        Speech: Speech normal.        Behavior: Behavior normal.     ED Results / Procedures / Treatments   Labs (all labs ordered are listed, but only abnormal results are displayed) Labs Reviewed  BASIC METABOLIC PANEL - Abnormal; Notable for the following components:      Result Value   CO2 19 (*)    Glucose, Bld 173 (*)    BUN 38 (*)    Creatinine, Ser 1.79 (*)    GFR, Estimated 38 (*)    All other components within normal limits  CBC - Abnormal; Notable for the following components:   WBC 11.5 (*)    All  other components within normal limits  D-DIMER, QUANTITATIVE - Abnormal; Notable for the following components:   D-Dimer, Quant 0.53 (*)    All other components within normal limits  I-STAT CG4 LACTIC ACID, ED - Abnormal; Notable for the following components:   Lactic Acid, Venous 2.0 (*)    All other components within normal limits  TROPONIN I (HIGH SENSITIVITY) - Abnormal; Notable for the following components:   Troponin I (High Sensitivity) 37 (*)    All other components within normal limits  TROPONIN I (HIGH SENSITIVITY) - Abnormal; Notable for the following components:   Troponin I (High Sensitivity) 41 (*)    All other components within normal limits  CK    EKG EKG Interpretation Date/Time:  Tuesday July 01 2023 00:51:07 EDT Ventricular Rate:  106 PR Interval:  142 QRS Duration:  136 QT Interval:  368 QTC Calculation: 488 R Axis:   -81  Text Interpretation: Sinus tachycardia with Premature supraventricular complexes Right bundle branch block Left anterior fascicular block ** Bifascicular block ** Abnormal ECG When compared with ECG of 06-Apr-2020 07:16, rate is faster Confirmed by Gilda Crease [phone]) on 07/01/2023 3:58:43 AM  Radiology DG Chest 2 View  Result Date: 07/01/2023 CLINICAL DATA:  Shortness of breath EXAM: CHEST - 2 VIEW COMPARISON:  12/08/2014 FINDINGS: The heart size and mediastinal contours are within normal limits. Both lungs are clear. The visualized skeletal structures are unremarkable. IMPRESSION: No active cardiopulmonary disease. Electronically Signed   By: Alcide Clever M.D.   On: 07/01/2023 01:31    Procedures Procedures    Medications Ordered in ED Medications  sodium chloride 0.9 % bolus 1,000 mL (0 mLs Intravenous Stopped 07/01/23 0407)    ED Course/ Medical Decision Making/ A&P                                 Medical Decision Making Amount and/or Complexity of Data Reviewed Labs: ordered. Decision-making details documented in  ED Course. Radiology: ordered and independent interpretation performed. Decision-making details documented in ED Course. ECG/medicine tests: ordered and independent interpretation performed. Decision-making details documented in ED Course.   Differential diagnosis considered includes, but not limited to:  Tachycardia; anxiety; arrhythmia; dehydration; PE  Presents to the emergency department with concerns over elevated heart rate.  Patient was alerted  by his watch that his heart rate was in the 120s.  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  He reports that he had cardiac testing some years ago and was told everything was okay.  Patient borderline tachycardic at arrival.  This improved with IV hydration.  He is noted to have an acute kidney injury.  Baseline creatinine from 3 years ago was 1.00.  His creatinine is 1.79 today with a BUN of 38.  This does appear to be prerenal, at least in part.  Patient did, however, have an elevated first troponin at 37, second was 41.  PE was considered due to the mildly elevated troponins and tachycardia.  His D-dimer, however is 0.53.  This is well within age-adjusted normal range of 0.79.  Troponin elevations may be secondary to spontaneous resolved arrhythmia that occurred prior to hospitalization.  Will, however, require further monitoring.        Final Clinical Impression(s) / ED Diagnoses Final diagnoses:  Palpitations  Elevated troponin    Rx / DC Orders ED Discharge Orders     None         Demyan Fugate, Canary Brim, MD 07/01/23 [phone]

## 2023-07-01 NOTE — Progress Notes (Signed)
  Echocardiogram 2D Echocardiogram has been performed.  Augustine Radar 07/01/2023, 4:20 PM

## 2023-07-01 NOTE — ED Notes (Signed)
ED TO INPATIENT HANDOFF REPORT  ED Nurse Name and Phone #: Kimbly Eanes [phone]  S Name/Age/Gender Anthony Glenn 77 y.o. male Room/Bed: 044C/044C  Code Status   Code Status: Full Code  Home/SNF/Other Home Patient oriented to: self, place, time, and situation Is this baseline? Yes   Triage Complete: Triage complete  Chief Complaint Tachycardia [R00.0]  Triage Note Pt in with concern for high heart rate, states his fitbit alarm went off for high HR up to 120's. Pt is diaphoretic, states he feels anxious, does have some exertional sob. No cp    Allergies No Known Allergies  Level of Care/Admitting Diagnosis ED Disposition     ED Disposition  Admit   Condition  --   Comment  Hospital Area: [facility] [[id number]]  Level of Care: Telemetry Cardiac [103]  May place patient in observation at [facility] or [name] if equivalent level of care is available:: No  Covid Evaluation: Asymptomatic - no recent exposure (last 10 days) testing not required  Diagnosis: Tachycardia [242249]  Admitting Physician: Clydie Braun [[id number]]  Attending Physician: Clydie Braun [[id number]]          B Medical/Surgery History Past Medical History:  Diagnosis Date   Anxiety    Arthritis    Epididymal mass    Glaucoma, both eyes    History of gastroesophageal reflux (GERD)    03-31-2020 per pt no issues since lost weight few yrs ago   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 melanoma    yrs ago excision of lower back area , localized and no recurrence per pt   History of prostate cancer    urologist--- dr [name]---  s/p  radical prostatectomy 12-21-2014 (04-03-2020 pt stated last PSA was undetactable   History of PSVT (paroxysmal supraventricular tachycardia) information obtained from epic Dr [name] office note 01-05-2010   01/ 2011  chest pain w/ palpitations,  hospital admission dx psvt,  s/p positive myoview but per cath a false positive myoview,  3wks even monitor showed no arrhythmia     (03-31-2020 per pt no issue w/ palpitations or heart racing since 2011, he feels it was anxiety)   Hyperlipidemia    Hypertension    followed by pcp   (pt had ETT/ myoview 12-06-2009 showed mild to moderate inferior ischemia then had cardiac cath 12-22-2009 showed no angiographic cad ,  false positive myoview)   OSA (obstructive sleep apnea)    03-31-2020  per pt stated last used cpap because he lost weight   PONV (postoperative nausea and vomiting)    Right hydrocele    Urinary incontinence    since s/p prostatectomy 2016   Wears glasses    Past Surgical History:  Procedure Laterality Date   HYDROCELE EXCISION N/A 04/06/2020   Procedure: RIGHT HYDROCELECTOMY ADULT AND EXCISION OF LEFT EPIDIDYMAL MASS;  Surgeon: [name], MD;  [facility]: [facility];  Service: Urology;  Laterality: N/A;   LYMPH NODE DISSECTION Bilateral 12/21/2014   Procedure: BILATERAL LYMPH NODE DISSECTION;  Surgeon: [name], MD;  Location: [facility];  Service: Urology;  Laterality: Bilateral;   PROSTATE BIOPSY  08/24/14   ROBOT ASSISTED LAPAROSCOPIC RADICAL PROSTATECTOMY N/A 12/21/2014   Procedure: ROBOTIC ASSISTED LAPAROSCOPIC RADICAL PROSTATECTOMY;  Surgeon: [name], MD;  Location: [facility];  Service: Urology;  Laterality: N/A;     A IV Location/Drains/Wounds Patient Lines/Drains/Airways Status     Active Line/Drains/Airways     Name Placement date Placement time Site Days  Peripheral IV 07/01/23 18 G 1.16" Anterior;Right Forearm 07/01/23  0243  Forearm  less than 1   Incision (Closed) 04/06/20 Scrotum Other (Comment) 04/06/20  0945  -- 1181   Incision - 6 Ports Abdomen 1: Left;Lateral;Lower 2: Left;Lateral;Upper 3: Superior;Umbilicus Right;Lower;Lateral Right;Upper;Lateral 6: Right;Medial;Upper 12/21/14  0920  -- 3114            Intake/Output Last 24 hours  Intake/Output Summary (Last 24 hours) at 07/01/2023 1857 Last data filed at 07/01/2023 0407 Gross per 24 hour  Intake 1000 ml   Output --  Net 1000 ml    Labs/Imaging Results for orders placed or performed during the hospital encounter of 07/01/23 (from the past 48 hour(s))  Basic metabolic panel     Status: Abnormal   Collection Time: 07/01/23  1:12 AM  Result Value Ref Range   Sodium 137 135 - 145 mmol/L   Potassium 3.9 3.5 - 5.1 mmol/L   Chloride 104 98 - 111 mmol/L   CO2 19 (L) 22 - 32 mmol/L   Glucose, Bld 173 (H) 70 - 99 mg/dL    Comment: Glucose reference range applies only to samples taken after fasting for at least 8 hours.   BUN 38 (H) 8 - 23 mg/dL   Creatinine, Ser 8.65 (H) 0.61 - 1.24 mg/dL   Calcium 9.0 8.9 - 78.4 mg/dL   GFR, Estimated 38 (L) >60 mL/min    Comment: (NOTE) Calculated using the CKD-EPI Creatinine Equation (2021)    Anion gap 14 5 - 15    Comment: Performed at [facility], [address], [place], [place] [address]  CBC     Status: Abnormal   Collection Time: 07/01/23  1:12 AM  Result Value Ref Range   WBC 11.5 (H) 4.0 - 10.5 K/uL   RBC 5.19 4.22 - 5.81 MIL/uL   Hemoglobin 15.7 13.0 - 17.0 g/dL   HCT 52.8 41.3 - 24.4 %   MCV 92.3 80.0 - 100.0 fL   MCH 30.3 26.0 - 34.0 pg   MCHC 32.8 30.0 - 36.0 g/dL   RDW 01.0 27.2 - 53.6 %   Platelets 303 150 - 400 K/uL   nRBC 0.0 0.0 - 0.2 %    Comment: Performed at [facility], [address] [place], [place] [address]  Troponin I (High Sensitivity)     Status: Abnormal   Collection Time: 07/01/23  1:12 AM  Result Value Ref Range   Troponin I (High Sensitivity) 37 (H) <18 ng/L    Comment: (NOTE) Elevated high sensitivity troponin I (hsTnI) values and significant  changes across serial measurements may suggest ACS but many other  chronic and acute conditions are known to elevate hsTnI results.  Refer to the "Links" section for chest pain algorithms and additional  guidance. Performed at [facility], [address] [place], [place] [address]   Troponin I (High Sensitivity)     Status: Abnormal    Collection Time: 07/01/23  2:44 AM  Result Value Ref Range   Troponin I (High Sensitivity) 41 (H) <18 ng/L    Comment: (NOTE) Elevated high sensitivity troponin I (hsTnI) values and significant  changes across serial measurements may suggest ACS but many other  chronic and acute conditions are known to elevate hsTnI results.  Refer to the "Links" section for chest pain algorithms and additional  guidance. Performed at [facility], [address] [place], [place] [address]   CK  Status: None   Collection Time: 07/01/23  2:44 AM  Result Value Ref Range   Total CK 294 49 - 397 U/L    Comment: Performed at [facility], [address], [place], [place] [address]  Lipid panel     Status: Abnormal   Collection Time: 07/01/23  2:44 AM  Result Value Ref Range   Cholesterol 121 0 - 200 mg/dL   Triglycerides 952 <841 mg/dL   HDL 30 (L) >32 mg/dL   Total CHOL/HDL Ratio 4.0 RATIO   VLDL 20 0 - 40 mg/dL   LDL Cholesterol 71 0 - 99 mg/dL    Comment:        Total Cholesterol/HDL:CHD Risk Coronary Heart Disease Risk Table                     Men   Women  1/2 Average Risk   3.4   3.3  Average Risk       5.0   4.4  2 X Average Risk   9.6   7.1  3 X Average Risk  23.4   11.0        Use the calculated Patient Ratio above and the CHD Risk Table to determine the patient's CHD Risk.        ATP III CLASSIFICATION (LDL):  <100     mg/dL   Optimal  440-102  mg/dL   Near or Above                    Optimal  130-159  mg/dL   Borderline  725-366  mg/dL   High  >440     mg/dL   Very High Performed at [facility], [address], [place], [place] [address]   D-dimer, quantitative     Status: Abnormal   Collection Time: 07/01/23  4:12 AM  Result Value Ref Range   D-Dimer, Quant 0.53 (H) 0.00 - 0.50 ug/mL-FEU    Comment: (NOTE) At the manufacturer cut-off value of 0.5 g/mL FEU, this assay has a negative predictive value of 95-100%.This assay is intended for use in  conjunction with a clinical pretest probability (PTP) assessment model to exclude pulmonary embolism (PE) and deep venous thrombosis (DVT) in outpatients suspected of PE or DVT. Results should be correlated with clinical presentation. Performed at [facility], [address], [place], [place] [address]   I-Stat CG4 Lactic Acid     Status: Abnormal   Collection Time: 07/01/23  4:28 AM  Result Value Ref Range   Lactic Acid, Venous 2.0 (HH) 0.5 - 1.9 mmol/L   Comment NOTIFIED PHYSICIAN   Respiratory (~20 pathogens) panel by PCR     Status: None   Collection Time: 07/01/23  9:10 AM   Specimen: Anterior Nasal Swab; Respiratory  Result Value Ref Range   Adenovirus NOT DETECTED NOT DETECTED   Coronavirus 229E NOT DETECTED NOT DETECTED    Comment: (NOTE) The Coronavirus on the Respiratory Panel, DOES NOT test for the novel  Coronavirus (2019 nCoV)    Coronavirus HKU1 NOT DETECTED NOT DETECTED   Coronavirus NL63 NOT DETECTED NOT DETECTED   Coronavirus OC43 NOT DETECTED NOT DETECTED   Metapneumovirus NOT DETECTED NOT DETECTED   Rhinovirus / Enterovirus NOT DETECTED NOT DETECTED   Influenza A NOT DETECTED NOT DETECTED   Influenza B NOT DETECTED NOT DETECTED   Parainfluenza Virus 1 NOT DETECTED NOT DETECTED   Parainfluenza Virus 2 NOT DETECTED  NOT DETECTED   Parainfluenza Virus 3 NOT DETECTED NOT DETECTED   Parainfluenza Virus 4 NOT DETECTED NOT DETECTED   Respiratory Syncytial Virus NOT DETECTED NOT DETECTED   Bordetella pertussis NOT DETECTED NOT DETECTED   Bordetella Parapertussis NOT DETECTED NOT DETECTED   Chlamydophila pneumoniae NOT DETECTED NOT DETECTED   Mycoplasma pneumoniae NOT DETECTED NOT DETECTED    Comment: Performed at [facility], [address], [place], [place] [address]  Resp panel by RT-PCR (RSV, Flu A&B, Covid) Anterior Nasal Swab     Status: None   Collection Time: 07/01/23  9:10 AM   Specimen: Anterior Nasal Swab  Result Value Ref Range   SARS  Coronavirus 2 by RT PCR NEGATIVE NEGATIVE   Influenza A by PCR NEGATIVE NEGATIVE   Influenza B by PCR NEGATIVE NEGATIVE    Comment: (NOTE) The Xpert Xpress SARS-CoV-2/FLU/RSV plus assay is intended as an aid in the diagnosis of influenza from Nasopharyngeal swab specimens and should not be used as a sole basis for treatment. Nasal washings and aspirates are unacceptable for Xpert Xpress SARS-CoV-2/FLU/RSV testing.  Fact Sheet for Patients: BloggerCourse.com  Fact Sheet for Healthcare Providers: SeriousBroker.it  This test is not yet approved or cleared by the [place] FDA and has been authorized for detection and/or diagnosis of SARS-CoV-2 by FDA under an Emergency Use Authorization (EUA). This EUA will remain in effect (meaning this test can be used) for the duration of the COVID-19 declaration under Section 564(b)(1) of the Act, 21 U.S.C. section 360bbb-3(b)(1), unless the authorization is terminated or revoked.     Resp Syncytial Virus by PCR NEGATIVE NEGATIVE    Comment: (NOTE) Fact Sheet for Patients: BloggerCourse.com  Fact Sheet for Healthcare Providers: SeriousBroker.it  This test is not yet approved or cleared by the [place] FDA and has been authorized for detection and/or diagnosis of SARS-CoV-2 by FDA under an Emergency Use Authorization (EUA). This EUA will remain in effect (meaning this test can be used) for the duration of the COVID-19 declaration under Section 564(b)(1) of the Act, 21 U.S.C. section 360bbb-3(b)(1), unless the authorization is terminated or revoked.  Performed at [facility], [address], [place], [place] [address]   Magnesium     Status: None   Collection Time: 07/01/23  9:10 AM  Result Value Ref Range   Magnesium 2.0 1.7 - 2.4 mg/dL    Comment: Performed at [facility], [address] [place], [place] [address]   TSH     Status: None   Collection Time: 07/01/23  9:10 AM  Result Value Ref Range   TSH 1.935 0.350 - 4.500 uIU/mL    Comment: Performed by a 3rd Generation assay with a functional sensitivity of <=0.01 uIU/mL. Performed at [facility], [address] [place], [place] [address]    ECHOCARDIOGRAM COMPLETE  Result Date: 07/01/2023    ECHOCARDIOGRAM REPORT   Patient Name:   Anthony Glenn Date of Exam: 07/01/2023 Medical Rec #:  [id number]       Height:       72.0 in Accession #:    [id number]      Weight:       265.0 lb Date of Birth:  13-Nov-1946       BSA:          2.401 m Patient Age:    77 years        BP:  168/67 mmHg Patient Gender: M               HR:           73 bpm. Exam Location:  Inpatient Procedure: 2D Echo, Cardiac Doppler, Color Doppler and Intracardiac            Opacification Agent Indications:    Elevated Troponin  History:        Patient has no prior history of Echocardiogram examinations.                 Risk Factors:Hypertension, Dyslipidemia and Sleep Apnea.  Sonographer:    Eulah Pont RDCS Referring Phys: [id number] RONDELL A SMITH IMPRESSIONS  1. Left ventricular ejection fraction, by estimation, is 60 to 65%. The left ventricle has normal function. The left ventricle has no regional wall motion abnormalities. Left ventricular diastolic parameters are consistent with Grade I diastolic dysfunction (impaired relaxation).  2. Right ventricular systolic function is normal. The right ventricular size is normal. There is normal pulmonary artery systolic pressure.  3. A small pericardial effusion is present. The pericardial effusion is anterior to the right ventricle.  4. The mitral valve is normal in structure. No evidence of mitral valve regurgitation. No evidence of mitral stenosis.  5. The aortic valve is tricuspid. Aortic valve regurgitation is not visualized. No aortic stenosis is present.  6. Aortic dilatation noted. There is mild dilatation of the aortic root,  measuring 44 mm. There is mild dilatation of the ascending aorta, measuring 40 mm.  7. The inferior vena cava is normal in size with greater than 50% respiratory variability, suggesting right atrial pressure of 3 mmHg. Comparison(s): No prior Echocardiogram. FINDINGS  Left Ventricle: Left ventricular ejection fraction, by estimation, is 60 to 65%. The left ventricle has normal function. The left ventricle has no regional wall motion abnormalities. Definity contrast agent was given IV to delineate the left ventricular  endocardial borders. The left ventricular internal cavity size was normal in size. There is no left ventricular hypertrophy. Left ventricular diastolic parameters are consistent with Grade I diastolic dysfunction (impaired relaxation). Right Ventricle: The right ventricular size is normal. No increase in right ventricular wall thickness. Right ventricular systolic function is normal. There is normal pulmonary artery systolic pressure. The tricuspid regurgitant velocity is 1.88 m/s, and  with an assumed right atrial pressure of 3 mmHg, the estimated right ventricular systolic pressure is 17.1 mmHg. Left Atrium: Left atrial size was normal in size. Right Atrium: Right atrial size was normal in size. Pericardium: A small pericardial effusion is present. The pericardial effusion is anterior to the right ventricle. Presence of epicardial fat layer. Mitral Valve: The mitral valve is normal in structure. No evidence of mitral valve regurgitation. No evidence of mitral valve stenosis. Tricuspid Valve: The tricuspid valve is normal in structure. Tricuspid valve regurgitation is not demonstrated. No evidence of tricuspid stenosis. Aortic Valve: The aortic valve is tricuspid. Aortic valve regurgitation is not visualized. No aortic stenosis is present. Pulmonic Valve: The pulmonic valve was normal in structure. Pulmonic valve regurgitation is not visualized. No evidence of pulmonic stenosis. Aorta: Aortic  dilatation noted. There is mild dilatation of the aortic root, measuring 44 mm. There is mild dilatation of the ascending aorta, measuring 40 mm. Venous: The inferior vena cava is normal in size with greater than 50% respiratory variability, suggesting right atrial pressure of 3 mmHg. IAS/Shunts: No atrial level shunt detected by color flow Doppler.  LEFT VENTRICLE PLAX 2D LVIDd:  4.70 cm      Diastology LVIDs:         3.30 cm      LV e' medial:    5.43 cm/s LV PW:         1.00 cm      LV E/e' medial:  11.4 LV IVS:        1.00 cm      LV e' lateral:   8.79 cm/s LVOT diam:     2.30 cm      LV E/e' lateral: 7.1 LV SV:         88 LV SV Index:   37 LVOT Area:     4.15 cm  LV Volumes (MOD) LV vol d, MOD A2C: 61.4 ml LV vol d, MOD A4C: 106.0 ml LV vol s, MOD A2C: 22.1 ml LV vol s, MOD A4C: 42.9 ml LV SV MOD A2C:     39.3 ml LV SV MOD A4C:     106.0 ml LV SV MOD BP:      48.3 ml RIGHT VENTRICLE RV S prime:     11.70 cm/s TAPSE (M-mode): 1.6 cm LEFT ATRIUM             Index        RIGHT ATRIUM           Index LA diam:        3.60 cm 1.50 cm/m   RA Area:     17.00 cm LA Vol (A2C):   47.0 ml 19.57 ml/m  RA Volume:   39.40 ml  16.41 ml/m LA Vol (A4C):   47.4 ml 19.74 ml/m LA Biplane Vol: 49.0 ml 20.41 ml/m  AORTIC VALVE LVOT Vmax:   110.00 cm/s LVOT Vmean:  73.400 cm/s LVOT VTI:    0.211 m  AORTA Ao Root diam: 4.40 cm Ao Asc diam:  4.00 cm MITRAL VALVE                TRICUSPID VALVE MV Area (PHT): 2.29 cm     TR Peak grad:   14.1 mmHg MV Decel Time: 331 msec     TR Vmax:        188.00 cm/s MV E velocity: 62.00 cm/s MV A velocity: 100.00 cm/s  SHUNTS MV E/A ratio:  0.62         Systemic VTI:  0.21 m                             Systemic Diam: 2.30 cm Riley Lam MD Electronically signed by Riley Lam MD Signature Date/Time: 07/01/2023/4:51:42 PM    Final    US RENAL  Result Date: 07/01/2023 CLINICAL DATA:  Acute kidney injury EXAM: RENAL / URINARY TRACT ULTRASOUND COMPLETE COMPARISON:  CT  abdomen pelvis 10/02/2011 FINDINGS: Right Kidney: Renal measurements: 11.2 x 7.1 x 4.9 cm = volume: 203 mL. Echogenicity within normal limits. No mass or hydronephrosis visualized. Left Kidney: Renal measurements: 4.8 x 6.5 x 5.9 cm = volume: 256 mL. Echogenicity within normal limits. No mass or hydronephrosis visualized. Bladder: Appears normal for degree of bladder distention. Other: None. IMPRESSION: No significant sonographic abnormality of the kidneys. Electronically Signed   By: Acquanetta Belling M.D.   On: 07/01/2023 10:33   DG Chest 2 View  Result Date: 07/01/2023 CLINICAL DATA:  Shortness of breath EXAM: CHEST - 2 VIEW COMPARISON:  12/08/2014 FINDINGS: The heart size and mediastinal contours are within normal  limits. Both lungs are clear. The visualized skeletal structures are unremarkable. IMPRESSION: No active cardiopulmonary disease. Electronically Signed   By: Alcide Clever M.D.   On: 07/01/2023 01:31    Pending Labs Unresulted Labs (From admission, onward)     Start     Ordered   07/02/23 0500  CBC  Tomorrow morning,   R        07/01/23 0752   07/02/23 0500  Basic metabolic panel  Tomorrow morning,   R        07/01/23 0752   07/01/23 0759  Hemoglobin A1c  Once,   R        07/01/23 0758   07/01/23 0751  Urinalysis, Routine w reflex microscopic -Urine, Clean Catch  Once,   R       Question:  Specimen Source  Answer:  Urine, Clean Catch   07/01/23 0752            Vitals/Pain Today's Vitals   07/01/23 1745 07/01/23 1800 07/01/23 1815 07/01/23 1854  BP: 109/68 134/83 129/84 123/70  Pulse: 63 80 80 84  Resp: 20 (!) 22 (!) 22 13  Temp:      TempSrc:      SpO2: 97% 97% 98% 94%  Weight:      Height:      PainSc:        Isolation Precautions Droplet precaution, Airborne and Contact precautions  Medications Medications  0.9 %  sodium chloride infusion ( Intravenous New Bag/Given 07/01/23 0718)  atorvastatin (LIPITOR) tablet 10 mg (10 mg Oral Not Given 07/01/23 0911)   enoxaparin (LOVENOX) injection 40 mg (40 mg Subcutaneous Not Given 07/01/23 0913)  sodium chloride flush (NS) 0.9 % injection 3 mL (3 mLs Intravenous Given 07/01/23 0906)  acetaminophen (TYLENOL) tablet 650 mg (has no administration in time range)    Or  acetaminophen (TYLENOL) suppository 650 mg (has no administration in time range)  albuterol (PROVENTIL) (2.5 MG/3ML) 0.083% nebulizer solution 2.5 mg (has no administration in time range)  ondansetron (ZOFRAN) tablet 4 mg (has no administration in time range)    Or  ondansetron (ZOFRAN) injection 4 mg (has no administration in time range)  metoprolol succinate (TOPROL-XL) 24 hr tablet 25 mg (25 mg Oral Given 07/01/23 1507)  perflutren lipid microspheres (DEFINITY) IV suspension (6 mLs Intravenous Given 07/01/23 1620)  sodium chloride 0.9 % bolus 1,000 mL (0 mLs Intravenous Stopped 07/01/23 0407)    Mobility walks     Focused Assessments Cardiac Assessment Handoff:    Lab Results  Component Value Date   CKTOTAL 294 07/01/2023   CKMB (HH) 12/01/2009    9.6 CRITICAL VALUE NOTED.  VALUE IS CONSISTENT WITH PREVIOUSLY REPORTED AND CALLED VALUE.   TROPONINI (H) 12/01/2009    0.09        PERSISTENTLY INCREASED TROPONIN VALUES IN THE RANGE OF 0.06-0.49 ng/mL CAN BE SEEN IN:       -UNSTABLE ANGINA       -CONGESTIVE HEART FAILURE       -MYOCARDITIS       -CHEST TRAUMA       -ARRYHTHMIAS       -LATE PRESENTING MI       -COPD   CLINICAL FOLLOW-UP RECOMMENDED.   Lab Results  Component Value Date   DDIMER 0.53 (H) 07/01/2023   Does the Patient currently have chest pain? No    R Recommendations: See Admitting Provider Note  Report given to:   Additional Notes:

## 2023-07-01 NOTE — ED Triage Notes (Addendum)
Pt in with concern for high heart rate, states his fitbit alarm went off for high HR up to 120's. Pt is diaphoretic, states he feels anxious, does have some exertional sob. No cp

## 2023-07-01 NOTE — H&P (Addendum)
History and Physical    Patient: Anthony Glenn AVW:[id number] DOB: August 20, 1946 DOA: 07/01/2023 DOS: the patient was seen and examined on 07/01/2023 PCP: Merlene Laughter, MD (Inactive)  Patient coming from: Home  Chief Complaint:  Chief Complaint  Patient presents with   Tachycardia   HPI: Anthony Glenn is a 78 y.o. male with medical history significant of hypertension, hyperlipidemia, PSVT, CAD, prostate cancer s/p robotic prostatectomy in 2016, anxiety, obesity, and OSA no longer on CPAP who presents with complaints of heart racing.  He notes that he had just gotten a smart watch recently and yesterday evening it had alerted him that his heart rate was up into the 120s.  He had not had chest pain or noticed palpitations.  However, seeing this made him anxious for which he believes he got worked up and noted associated shortness of breath.  He had been working outside, but reports that he had been trying to keep himself adequately hydrated.  He has been taking his medications of atorvastatin and lisinopril hydrochlorothiazide as prescribed.  Review of records make note that patient had previously been by cardiology back in 2011 after having episodes of SVT.  He wore a Holter monitor which noted episodes of atrial tachycardia and AVNRT.  He was started on metoprolol succinate.  Records note he had been on 25 mg and subsequently records note 50 mg/day back in 2016, but at some point the medication was discontinued.  It is unclear on why the medication was stopped and he does not recall.   In the emergency department patient was noted to be afebrile, pulse elevated up to 110, respirations 12-22, blood pressures 95/79-179/102, and O2 saturation maintained on room air.  Labs significant for 11.5, BUN 38, creatinine 1.79, glucose 173, CK 294, D-dimer 0.53, high-sensitivity troponins 37->41, and lactic acid 2.  Chest x-ray showed no acute abnormality.  Patient had been given 1 L of normal saline and then  placed on radiata 75 mL/h.  Review of Systems: As mentioned in the history of present illness. All other systems reviewed and are negative. Past Medical History:  Diagnosis Date   Anxiety    Arthritis    Epididymal mass    Glaucoma, both eyes    History of gastroesophageal reflux (GERD)    03-31-2020 per pt no issues since lost weight few yrs ago   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 melanoma    yrs ago excision of lower back area , localized and no recurrence per pt   History of prostate cancer    urologist--- dr [name]---  s/p  radical prostatectomy 12-21-2014 (04-03-2020 pt stated last PSA was undetactable   History of PSVT (paroxysmal supraventricular tachycardia) information obtained from epic Dr [name] office note 01-05-2010   01/ 2011  chest pain w/ palpitations,  hospital admission dx psvt,  s/p positive myoview but per cath a false positive myoview,  3wks even monitor showed no arrhythmia    (03-31-2020 per pt no issue w/ palpitations or heart racing since 2011, he feels it was anxiety)   Hyperlipidemia    Hypertension    followed by pcp   (pt had ETT/ myoview 12-06-2009 showed mild to moderate inferior ischemia then had cardiac cath 12-22-2009 showed no angiographic cad ,  false positive myoview)   OSA (obstructive sleep apnea)    03-31-2020  per pt stated last used cpap because he lost weight   PONV (postoperative nausea and vomiting)    Right hydrocele    Urinary incontinence  since s/p prostatectomy 2016   Wears glasses    Past Surgical History:  Procedure Laterality Date   HYDROCELE EXCISION N/A 04/06/2020   Procedure: RIGHT HYDROCELECTOMY ADULT AND EXCISION OF LEFT EPIDIDYMAL MASS;  Surgeon: [name], MD;  Location: [facility];  Service: Urology;  Laterality: N/A;   LYMPH NODE DISSECTION Bilateral 12/21/2014   Procedure: BILATERAL LYMPH NODE DISSECTION;  Surgeon: [name], MD;  Location: [facility];  Service: Urology;  Laterality: Bilateral;   PROSTATE BIOPSY   08/24/14   ROBOT ASSISTED LAPAROSCOPIC RADICAL PROSTATECTOMY N/A 12/21/2014   Procedure: ROBOTIC ASSISTED LAPAROSCOPIC RADICAL PROSTATECTOMY;  Surgeon: [name], MD;  Location: [facility];  Service: Urology;  Laterality: N/A;   Social History:  reports that he quit smoking about 54 years ago. His smoking use included cigarettes. He started smoking about 56 years ago. He quit smokeless tobacco use about 54 years ago.  His smokeless tobacco use included chew. He reports current alcohol use. He reports that he does not use drugs.  No Known Allergies  Family History  Problem Relation Age of Onset   Cancer Father        laryngeal    Prior to Admission medications   Medication Sig Start Date End Date Taking? Authorizing Provider  atorvastatin (LIPITOR) 10 MG tablet Take 10 mg by mouth daily.    [provider]  dorzolamide-timolol (COSOPT) 22.3-6.8 MG/ML ophthalmic solution Place 1 drop into both eyes daily.     [provider]  latanoprost (XALATAN) 0.005 % ophthalmic solution Place 1 drop into both eyes at bedtime.     [provider]  lisinopril-hydrochlorothiazide (PRINZIDE,ZESTORETIC) 20-12.5 MG per tablet Take 1 tablet by mouth every morning.     [provider]    Physical Exam: Vitals:   07/01/23 0530 07/01/23 0534 07/01/23 0600 07/01/23 0630  BP: (!) 149/96  (!) 179/102 (!) 168/67  Pulse: 71  80 79  Resp: 16  16 17   Temp:  97.9 F (36.6 C)    TempSrc:  Axillary    SpO2: 94%  96% 95%  Weight:      Height:       Exam  Constitutional: Early male who appears to be in no acute distress at this time Eyes: PERRL, lids and conjunctivae normal ENMT: Mucous membranes are moist.   Neck: normal, supple  Respiratory: clear to auscultation bilaterally, no wheezing, no crackles. Normal respiratory effort.   Cardiovascular: Regular rate and rhythm, no murmurs / rubs / gallops. No extremity edema. 2+ pedal pulses.   Abdomen: no tenderness, no masses  palpated.  Bowel sounds positive.  Musculoskeletal: no clubbing / cyanosis. No joint deformity upper and lower extremities. Good ROM, no contractures. Normal muscle tone.  Skin: no rashes, lesions, ulcers. No induration Neurologic: CN 2-12 grossly intact. Sensation intact, DTR normal. Strength 5/5 in all 4.  Psychiatric: Normal judgment and insight. Alert and oriented x 3. Normal mood.   Data Reviewed:  EKG revealed sinus tachycardia 106 bpm with with PVCs and a bifascicular block.  Reviewed labs, imaging, and records as noted in this document.  Assessment and Plan:  Arrhythmia History of paroxysmal SVT Acute on chronic.  Patient presents with complaints of elevated heart rates on his smart watch into the 120s.  EKG reveals a sinus tachycardia with PVCs and bifascicular block.  Patient with a prior history of SVT for which telemetry noted atrial tachycardia or atypical AVNRT back in 2011.  He was previously  on metoprolol succinate, but appears to have been discontinued sometime after 2016. -Admit to a cardiac telemetry bed -Check TSH -Check echocardiogram -Start metoprolol succinate 25 mg daily -Follow-up telemetry -Cardiology consulted,  will follow-up for any further recommendations.  May warrant repeat Holter monitor in outpatient setting  Elevated troponin CAD Acute.  High-sensitivity troponin 37->41.  Patient with prior left heart cath in 12/2009 which noted normal coronaries and EF 55% for which prior Myoview stress test which noted mild to to moderate inferior ischemia was thought to be a false positive.  Troponin elevation could be secondary to demand in setting of elevated heart rates with decreasing kidney function. -Check lipid panel -Check echocardiogram  Leukocytosis Lactic acidosis Acute.  On admission WBC 11.5 and initial lactic acid 2.  Patient was noted to be afebrile and denies any infectious symptoms.  Chest x-ray did not note any acute abnormality.  Lactic acidosis  could be secondary to dehydration and poor perfusion with elevated heart rates.  Leukocytosis could possibly be reactive in nature. -Check respiratory virus panel -Check urinalysis -Trend lactic acid levels -Orders placed to check blood cultures if patient develops fever.  Will monitor off of antibiotics at this time -Recheck CBC in a.m.  Acute kidney injury Creatinine 1.79 with BUN 38.  Baseline creatinine previously noted to be around 1 back in 2021.  Patient was bolused 1 L of normal saline IV fluids and then placed on drip at 75 mL/h. -Avoid nephrotoxic agents -Follow-up urinalysis -Check renal ultrasound -Continue IV fluids  Hyperglycemia Acute.  Glucose elevated at 173.  No prior history of diabetes noted. -Check hemoglobin A1c -Continue to monitor and consider starting subcutaneous insulin if blood sugars trending above 180  Essential hypertension On admission blood pressures noted to be as 95/79 and after 1 L of IV fluids elevated up to 179/102.  Home blood pressure regimen includes lisinopril-hydrochlorothiazide 20-12.5 mg. -Hold lisinopril hydrochlorothiazide due to AKI -Start metoprolol succinate 25mg  daily.  Adjust dose as recommended -Hydralazine IV as needed for elevated blood pressures  Hyperlipidemia -Continue atorvastatin  Obesity BMI 35.94 kg/m  DVT prophylaxis: Lovenox Advance Care Planning:   Code Status: Full Code    Consults: Cardiology  Family Communication: Son updated at bedside  Severity of Illness: The appropriate patient status for this patient is OBSERVATION. Observation status is judged to be reasonable and necessary in order to provide the required intensity of service to ensure the patient's safety. The patient's presenting symptoms, physical exam findings, and initial radiographic and laboratory data in the context of their medical condition is felt to place them at decreased risk for further clinical deterioration. Furthermore, it is  anticipated that the patient will be medically stable for discharge from the hospital within 2 midnights of admission.   Author: Clydie Braun, MD 07/01/2023 7:38 AM  For on call review [email or web address].

## 2023-07-01 NOTE — ED Notes (Signed)
Patient transported to Ultrasound 

## 2023-07-01 NOTE — ED Notes (Signed)
ED TO INPATIENT HANDOFF REPORT  ED Nurse Name and Phone #: lamor bonenberger, [place] [phone]  S Name/Age/Gender Anthony Glenn 77 y.o. male Room/Bed: 033C/033C  Code Status   Code Status: Prior  Home/SNF/Other Home Patient oriented to: self, place, time, and situation Is this baseline? Yes   Triage Complete: Triage complete  Chief Complaint Tachycardia [R00.0]  Triage Note Pt in with concern for high heart rate, states his fitbit alarm went off for high HR up to 120's. Pt is diaphoretic, states he feels anxious, does have some exertional sob. No cp    Allergies No Known Allergies  Level of Care/Admitting Diagnosis ED Disposition     ED Disposition  Admit   Condition  --   Comment  Hospital Area: [facility] [[id number]]  Level of Care: Telemetry Cardiac [103]  May place patient in observation at [facility] or [name] if equivalent level of care is available:: No  Covid Evaluation: Asymptomatic - no recent exposure (last 10 days) testing not required  Diagnosis: Tachycardia [242249]  Admitting Physician: Clydie Braun [[id number]]  Attending Physician: Clydie Braun [[id number]]          B Medical/Surgery History Past Medical History:  Diagnosis Date   Anxiety    Arthritis    Epididymal mass    Glaucoma, both eyes    History of gastroesophageal reflux (GERD)    03-31-2020 per pt no issues since lost weight few yrs ago   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 melanoma    yrs ago excision of lower back area , localized and no recurrence per pt   History of prostate cancer    urologist--- dr [name]---  s/p  radical prostatectomy 12-21-2014 (04-03-2020 pt stated last PSA was undetactable   History of PSVT (paroxysmal supraventricular tachycardia) information obtained from epic Dr [name] office note 01-05-2010   01/ 2011  chest pain w/ palpitations,  hospital admission dx psvt,  s/p positive myoview but per cath a false positive myoview,  3wks even monitor showed no  arrhythmia    (03-31-2020 per pt no issue w/ palpitations or heart racing since 2011, he feels it was anxiety)   Hyperlipidemia    Hypertension    followed by pcp   (pt had ETT/ myoview 12-06-2009 showed mild to moderate inferior ischemia then had cardiac cath 12-22-2009 showed no angiographic cad ,  false positive myoview)   OSA (obstructive sleep apnea)    03-31-2020  per pt stated last used cpap because he lost weight   PONV (postoperative nausea and vomiting)    Right hydrocele    Urinary incontinence    since s/p prostatectomy 2016   Wears glasses    Past Surgical History:  Procedure Laterality Date   HYDROCELE EXCISION N/A 04/06/2020   Procedure: RIGHT HYDROCELECTOMY ADULT AND EXCISION OF LEFT EPIDIDYMAL MASS;  Surgeon: [name], MD;  Location: [facility];  Service: Urology;  Laterality: N/A;   LYMPH NODE DISSECTION Bilateral 12/21/2014   Procedure: BILATERAL LYMPH NODE DISSECTION;  Surgeon: [name], MD;  Location: [facility];  Service: Urology;  Laterality: Bilateral;   PROSTATE BIOPSY  08/24/14   ROBOT ASSISTED LAPAROSCOPIC RADICAL PROSTATECTOMY N/A 12/21/2014   Procedure: ROBOTIC ASSISTED LAPAROSCOPIC RADICAL PROSTATECTOMY;  Surgeon: [name], MD;  Location: [facility];  Service: Urology;  Laterality: N/A;     A IV Location/Drains/Wounds Patient Lines/Drains/Airways Status     Active Line/Drains/Airways     Name Placement date Placement time Site  Days   Peripheral IV 07/01/23 18 G 1.16" Anterior;Right Forearm 07/01/23  0243  Forearm  less than 1   Incision (Closed) 04/06/20 Scrotum Other (Comment) 04/06/20  0945  -- 1181   Incision - 6 Ports Abdomen 1: Left;Lateral;Lower 2: Left;Lateral;Upper 3: Superior;Umbilicus Right;Lower;Lateral Right;Upper;Lateral 6: Right;Medial;Upper 12/21/14  0920  -- 3114            Intake/Output Last 24 hours  Intake/Output Summary (Last 24 hours) at 07/01/2023 0745 Last data filed at 07/01/2023 0407 Gross per 24 hour   Intake 1000 ml  Output --  Net 1000 ml    Labs/Imaging Results for orders placed or performed during the hospital encounter of 07/01/23 (from the past 48 hour(s))  Basic metabolic panel     Status: Abnormal   Collection Time: 07/01/23  1:12 AM  Result Value Ref Range   Sodium 137 135 - 145 mmol/L   Potassium 3.9 3.5 - 5.1 mmol/L   Chloride 104 98 - 111 mmol/L   CO2 19 (L) 22 - 32 mmol/L   Glucose, Bld 173 (H) 70 - 99 mg/dL    Comment: Glucose reference range applies only to samples taken after fasting for at least 8 hours.   BUN 38 (H) 8 - 23 mg/dL   Creatinine, Ser 0.86 (H) 0.61 - 1.24 mg/dL   Calcium 9.0 8.9 - 57.8 mg/dL   GFR, Estimated 38 (L) >60 mL/min    Comment: (NOTE) Calculated using the CKD-EPI Creatinine Equation (2021)    Anion gap 14 5 - 15    Comment: Performed at [facility], [address], [place], [place] [address]  CBC     Status: Abnormal   Collection Time: 07/01/23  1:12 AM  Result Value Ref Range   WBC 11.5 (H) 4.0 - 10.5 K/uL   RBC 5.19 4.22 - 5.81 MIL/uL   Hemoglobin 15.7 13.0 - 17.0 g/dL   HCT 95.2 84.1 - 32.4 %   MCV 92.3 80.0 - 100.0 fL   MCH 30.3 26.0 - 34.0 pg   MCHC 32.8 30.0 - 36.0 g/dL   RDW 40.1 02.7 - 25.3 %   Platelets 303 150 - 400 K/uL   nRBC 0.0 0.0 - 0.2 %    Comment: Performed at [facility], [address] [place], [place] [address]  Troponin I (High Sensitivity)     Status: Abnormal   Collection Time: 07/01/23  1:12 AM  Result Value Ref Range   Troponin I (High Sensitivity) 37 (H) <18 ng/L    Comment: (NOTE) Elevated high sensitivity troponin I (hsTnI) values and significant  changes across serial measurements may suggest ACS but many other  chronic and acute conditions are known to elevate hsTnI results.  Refer to the "Links" section for chest pain algorithms and additional  guidance. Performed at [facility], [address] [place], [place] [address]   Troponin I (High Sensitivity)     Status:  Abnormal   Collection Time: 07/01/23  2:44 AM  Result Value Ref Range   Troponin I (High Sensitivity) 41 (H) <18 ng/L    Comment: (NOTE) Elevated high sensitivity troponin I (hsTnI) values and significant  changes across serial measurements may suggest ACS but many other  chronic and acute conditions are known to elevate hsTnI results.  Refer to the "Links" section for chest pain algorithms and additional  guidance. Performed at [facility], [address], [place], [place] [address]   CK  Status: None   Collection Time: 07/01/23  2:44 AM  Result Value Ref Range   Total CK 294 49 - 397 U/L    Comment: Performed at [facility], [address], [place], [place] [address]  D-dimer, quantitative     Status: Abnormal   Collection Time: 07/01/23  4:12 AM  Result Value Ref Range   D-Dimer, Quant 0.53 (H) 0.00 - 0.50 ug/mL-FEU    Comment: (NOTE) At the manufacturer cut-off value of 0.5 g/mL FEU, this assay has a negative predictive value of 95-100%.This assay is intended for use in conjunction with a clinical pretest probability (PTP) assessment model to exclude pulmonary embolism (PE) and deep venous thrombosis (DVT) in outpatients suspected of PE or DVT. Results should be correlated with clinical presentation. Performed at [facility], [address], [place], [place] [address]   I-Stat CG4 Lactic Acid     Status: Abnormal   Collection Time: 07/01/23  4:28 AM  Result Value Ref Range   Lactic Acid, Venous 2.0 (HH) 0.5 - 1.9 mmol/L   Comment NOTIFIED PHYSICIAN    DG Chest 2 View  Result Date: 07/01/2023 CLINICAL DATA:  Shortness of breath EXAM: CHEST - 2 VIEW COMPARISON:  12/08/2014 FINDINGS: The heart size and mediastinal contours are within normal limits. Both lungs are clear. The visualized skeletal structures are unremarkable. IMPRESSION: No active cardiopulmonary disease. Electronically Signed   By: Alcide Clever M.D.   On: 07/01/2023 01:31    Pending  Labs Unresulted Labs (From admission, onward)    None       Vitals/Pain Today's Vitals   07/01/23 0530 07/01/23 0534 07/01/23 0600 07/01/23 0630  BP: (!) 149/96  (!) 179/102 (!) 168/67  Pulse: 71  80 79  Resp: 16  16 17   Temp:  97.9 F (36.6 C)    TempSrc:  Axillary    SpO2: 94%  96% 95%  Weight:      Height:      PainSc:        Isolation Precautions No active isolations  Medications Medications  0.9 %  sodium chloride infusion ( Intravenous New Bag/Given 07/01/23 0718)  sodium chloride 0.9 % bolus 1,000 mL (0 mLs Intravenous Stopped 07/01/23 0407)    Mobility walks     Focused Assessments Cardiac Assessment Handoff:    Lab Results  Component Value Date   CKTOTAL 294 07/01/2023   CKMB (HH) 12/01/2009    9.6 CRITICAL VALUE NOTED.  VALUE IS CONSISTENT WITH PREVIOUSLY REPORTED AND CALLED VALUE.   TROPONINI (H) 12/01/2009    0.09        PERSISTENTLY INCREASED TROPONIN VALUES IN THE RANGE OF 0.06-0.49 ng/mL CAN BE SEEN IN:       -UNSTABLE ANGINA       -CONGESTIVE HEART FAILURE       -MYOCARDITIS       -CHEST TRAUMA       -ARRYHTHMIAS       -LATE PRESENTING MI       -COPD   CLINICAL FOLLOW-UP RECOMMENDED.   Lab Results  Component Value Date   DDIMER 0.53 (H) 07/01/2023   Does the Patient currently have chest pain? No    R Recommendations: See Admitting Provider Note  Report given to:   Additional Notes: hx panic attacks, pt states trop always goes up when this happens, very pleasant individual

## 2023-07-01 NOTE — Progress Notes (Signed)
  Echocardiogram 2D Echocardiogram has been attempted. Pt OTF. Will attempt again as time permits.  Augustine Radar 07/01/2023, 8:21 AM

## 2023-07-01 NOTE — Consult Note (Addendum)
Cardiology Consultation   Patient ID: FODE GULINO MRN: [id number]; DOB: 1946/09/03  Admit date: 07/01/2023 Date of Consult: 07/01/2023  PCP:  Merlene Laughter, MD (Inactive)   [facility] Providers Cardiologist:  None        Patient Profile:   Anthony Glenn is a 77 y.o. 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 Katrinka Blazing.  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 Placed on BB.  He had cardiac cath for abnormal stress test and mildly elevated troponin with the SVT and found to have normal coronary arteries.  Pt last seen by cardiology in 2011.   Pt presented today for tachycardia - his smart watch alarmed for increased HR.  He had not been aware of fast HR.  He became anxious and then noticed SOB.  He had been working outside.  No chest pain.  He has been off the BB for several years.  His Cr was up --He was given IV fluids 1 L -  hr now 106 he was hypotensive on admit at 95/79 improved with IV fluids.    He is admitted and BB added.  Holding linsopril hydrochlorothiazide   Prior to today has been doing well.  He is active.   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  Telemetry:  Telemetry was personally reviewed and demonstrates:  SR to ST  HS troponin 37>>41  CK total 294 Na 137 K+ 3.9 BUN 38 Cr 1.79  (prior Cr 1.00 in 2021) WBC 11.5 Hgb 15.7 pts 303  2V CXR NAD  TSH 1.935  Mg+ 2.0 DDimer 0.53   BP 112/70 P 84 R 20 afebrile  Past Medical History:  Diagnosis Date   Anxiety    Arthritis    Epididymal mass    Glaucoma, both eyes    History of gastroesophageal reflux (GERD)    03-31-2020 per pt no issues since lost weight few yrs ago   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 melanoma    yrs ago excision of lower  back area , localized and no recurrence per pt   History of prostate cancer    urologist--- dr [name]---  s/p  radical prostatectomy 12-21-2014 (04-03-2020 pt stated last PSA was undetactable   History of PSVT (paroxysmal supraventricular tachycardia) information obtained from epic Dr [name] office note 01-05-2010   01/ 2011  chest pain w/ palpitations,  hospital admission dx psvt,  s/p positive myoview but per cath a false positive myoview,  3wks even monitor showed no arrhythmia    (03-31-2020 per pt no issue w/ palpitations or heart racing since 2011, he feels it was anxiety)   Hyperlipidemia    Hypertension    followed by pcp   (pt had ETT/ myoview 12-06-2009 showed mild to moderate inferior ischemia then had cardiac cath 12-22-2009 showed no angiographic cad ,  false positive myoview)   OSA (obstructive sleep apnea)    03-31-2020  per pt stated last used cpap because he lost weight   PONV (postoperative nausea and vomiting)    Right hydrocele    Urinary incontinence    since s/p prostatectomy 2016   Wears glasses     Past Surgical  History:  Procedure Laterality Date   HYDROCELE EXCISION N/A 04/06/2020   Procedure: RIGHT HYDROCELECTOMY ADULT AND EXCISION OF LEFT EPIDIDYMAL MASS;  Surgeon: [name], MD;  Location: [facility];  Service: Urology;  Laterality: N/A;   LYMPH NODE DISSECTION Bilateral 12/21/2014   Procedure: BILATERAL LYMPH NODE DISSECTION;  Surgeon: [name], MD;  Location: [facility];  Service: Urology;  Laterality: Bilateral;   PROSTATE BIOPSY  08/24/14   ROBOT ASSISTED LAPAROSCOPIC RADICAL PROSTATECTOMY N/A 12/21/2014   Procedure: ROBOTIC ASSISTED LAPAROSCOPIC RADICAL PROSTATECTOMY;  Surgeon: [name], MD;  Location: [facility];  Service: Urology;  Laterality: N/A;     Home Medications:  Prior to Admission medications   Medication Sig Start Date End Date Taking? Authorizing Provider  atorvastatin (LIPITOR) 10 MG tablet Take 10 mg by mouth daily.   Yes  [provider]  dorzolamide-timolol (COSOPT) 22.3-6.8 MG/ML ophthalmic solution Place 1 drop into both eyes daily.    Yes [provider]  latanoprost (XALATAN) 0.005 % ophthalmic solution Place 1 drop into both eyes at bedtime.    Yes [provider]  lisinopril-hydrochlorothiazide (PRINZIDE,ZESTORETIC) 20-12.5 MG per tablet Take 1 tablet by mouth every morning.    Yes [provider]    Inpatient Medications: Scheduled Meds:  atorvastatin  10 mg Oral Daily   enoxaparin (LOVENOX) injection  40 mg Subcutaneous Daily   metoprolol succinate  25 mg Oral Daily   sodium chloride flush  3 mL Intravenous Q12H   Continuous Infusions:  sodium chloride 75 mL/hr at 07/01/23 0718   PRN Meds: acetaminophen **OR** acetaminophen, albuterol, ondansetron **OR** ondansetron (ZOFRAN) IV  Allergies:   No Known Allergies  Social History:   Social History   Socioeconomic History   Marital status: Widowed    Spouse name: Not on file   Number of children: 2   Years of education: Not on file   Highest education level: Not on file  Occupational History   Occupation: service tech  Tobacco Use   Smoking status: Former    Current packs/day: 0.00    Types: Cigarettes    Start date: 05/21/1967    Quit date: 11/18/1968    Years since quitting: 54.6   Smokeless tobacco: Former    Types: Chew    Quit date: 03/31/1969  Vaping Use   Vaping status: Never Used  Substance and Sexual Activity   Alcohol use: Yes    Alcohol/week: 0.0 standard drinks of alcohol    Comment: occasional   Drug use: Never   Sexual activity: Not on file    Comment: vasectomy  Other Topics Concern   Not on file  Social History Narrative   Not on file   Social Determinants of Health   Financial Resource Strain: Not on file  Food Insecurity: Not on file  Transportation Needs: Not on file  Physical Activity: Not on file  Stress: Not on file  Social Connections: Not on file  Intimate  Partner Violence: Not on file    Family History:    Family History  Problem Relation Age of Onset   Cancer Father        laryngeal     ROS:  Please see the history of present illness.  General:no colds or fevers, no weight changes Skin:no rashes or ulcers HEENT:no blurred vision, no congestion CV:see HPI PUL:see HPI GI:no diarrhea constipation or melena, no indigestion GU:no hematuria, no dysuria MS:no joint pain, no claudication Neuro:no syncope, no lightheadedness Endo:no diabetes  but glucose at 173 , no thyroid disease  All other ROS reviewed and negative.     Physical Exam/Data:   Vitals:   07/07/23 0930 2023/07/07 1006 07-Jul-2023 1030 07-Jul-2023 1400  BP: 137/86  136/89   Pulse: 73  78 74  Resp: 19  20 14   Temp:  98.7 F (37.1 C)    TempSrc:  Oral    SpO2: 94%  94% 93%  Weight:      Height:        Intake/Output Summary (Last 24 hours) at 07-07-2023 1453 Last data filed at 07/07/23 0407 Gross per 24 hour  Intake 1000 ml  Output --  Net 1000 ml      07/07/23    2:43 AM 07-07-23    1:04 AM 04/06/2020    8:08 AM  Last 3 Weights  Weight (lbs) 265 lb 265 lb 227 lb 15.3 oz  Weight (kg) 120.203 kg 120.203 kg 103.4 kg     Body mass index is 35.94 kg/m.  General:  Well nourished, well developed, in no acute distress HEENT: normal Neck: no JVD Vascular: No carotid bruits; Distal pulses 2+ bilaterally Cardiac:  normal S1, S2; RRR; no murmur gallup rub or click Lungs:  clear to auscultation bilaterally, no wheezing, rhonchi or rales  Abd: soft, nontender, no hepatomegaly  Ext: no edema Musculoskeletal:  No deformities, BUE and BLE strength normal and equal Skin: warm and dry  Neuro:  alert and oriented X 3 MAE follows commands, no focal abnormalities noted Psych:  Normal affect    Relevant CV Studies:  Echo 2011 with EF 50-55% G1DD and lt atrial septal aneurysm and PA systolic pressure 32  Echo pending  Laboratory Data:  High Sensitivity Troponin:    Recent Labs  Lab Jul 07, 2023 0112 2023/07/07 0244  TROPONINIHS 37* 41*     Chemistry Recent Labs  Lab Jul 07, 2023 0112 2023-07-07 0910  NA 137  --   K 3.9  --   CL 104  --   CO2 19*  --   GLUCOSE 173*  --   BUN 38*  --   CREATININE 1.79*  --   CALCIUM 9.0  --   MG  --  2.0  GFRNONAA 38*  --   ANIONGAP 14  --     No results for input(s): "PROT", "ALBUMIN", "AST", "ALT", "ALKPHOS", "BILITOT" in the last 168 hours. Lipids No results for input(s): "CHOL", "TRIG", "HDL", "LABVLDL", "LDLCALC", "CHOLHDL" in the last 168 hours.  Hematology Recent Labs  Lab 2023-07-07 0112  WBC 11.5*  RBC 5.19  HGB 15.7  HCT 47.9  MCV 92.3  MCH 30.3  MCHC 32.8  RDW 12.5  PLT 303   Thyroid  Recent Labs  Lab 07-07-23 0910  TSH 1.935    BNPNo results for input(s): "BNP", "PROBNP" in the last 168 hours.  DDimer  Recent Labs  Lab 2023-07-07 0412  DDIMER 0.53*     Radiology/Studies:  US RENAL  Result Date: 07/07/23 CLINICAL DATA:  Acute kidney injury EXAM: RENAL / URINARY TRACT ULTRASOUND COMPLETE COMPARISON:  CT abdomen pelvis 10/02/2011 FINDINGS: Right Kidney: Renal measurements: 11.2 x 7.1 x 4.9 cm = volume: 203 mL. Echogenicity within normal limits. No mass or hydronephrosis visualized. Left Kidney: Renal measurements: 4.8 x 6.5 x 5.9 cm = volume: 256 mL. Echogenicity within normal limits. No mass or hydronephrosis visualized. Bladder: Appears normal for degree of bladder distention. Other: None. IMPRESSION: No significant sonographic abnormality of the kidneys. Electronically Signed  By: Mauri Reading  Mir M.D.   On: 07/01/2023 10:33   DG Chest 2 View  Result Date: 07/01/2023 CLINICAL DATA:  Shortness of breath EXAM: CHEST - 2 VIEW COMPARISON:  12/08/2014 FINDINGS: The heart size and mediastinal contours are within normal limits. Both lungs are clear. The visualized skeletal structures are unremarkable. IMPRESSION: No active cardiopulmonary disease. Electronically Signed   By: Alcide Clever M.D.    On: 07/01/2023 01:31     Assessment and Plan:   HR 120 at home, he was not aware of rapid HR.  Now 106 ST -prior SVT pt felt palpitations and had chest pain.  He has not been on his BB EKG without acute changes - no SVT  may have been ST alone with dehydration.  Consider live monitor at discharge vs 30 day event.  Elevated troponin - nonobstruction to minimal disease in 2011 on cath. This may be due to hypotension and ST  no angina echo pending further workup depending on echo  AKI/Leukocytosis/hyperglycemia/HTN per IM    Risk Assessment/Risk Scores:     For questions or updates, please contact [facility] Please consult [email or web address] for contact info under    Signed, Nada Boozer, NP  07/01/2023 2:53 PM   Patient seen and examined. Agree with assessment and plan.  Anthony Glenn is a very pleasant 77 year old gentleman who is a former patient of Dr. [name].  2011 he had experienced palpitations with  SVT/atrial tachycardia versus atypical AV nodal reentrant tachycardia had experienced mild associated chest discomfort.  And chest pain.  A nuclear Myoview study raise concern for possible inferior ischemia.  Subsequent cardiac catheterization did not show any coronary obstructive disease.  At the time, in the setting of his increased heart rate, he did have mild transaminase elevation consistent with demand ischemia.  He has a history of OSA and remotely had used CPAP therapy.  Recently, he has been doing well and has had significant weight loss.  He has a history of hypertension and has been on lisinopril/HCTZ.  He recently received received a new watch with heart rate monitoring.  Last evening he had been fairly active.  He sweats easily.  He may have been dehydrated but noticed his heart rate increased to approximately 120 bpm.  He called his son.  He denied associated chest pressure.  He ultimately presented to the emergency room revealed sinus rhythm with sinus tachycardia.   Troponins are minimally increased not consistent with ACS and possibly suggestive of rate related demand ischemia.  Presently, blood pressure is stable 130/84, pulse is in the 80s in sinus rhythm.  HEENT is unremarkable.  He has a thick neck.  Lungs are clear.  Rhythm is regular with no S3 gallop.  There is mild central adiposity.  Bowel sounds are positive.  There is no clubbing cyanosis or edema.  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  An echo Doppler study has just been completed.  Results are pending.  The patient will be kept overnight for observation.  Wait echo results.  If echo within normal limits anticipate discharge tomorrow with outpatient monitor.  He mains fairly active without chest pain.  Will reassess in AM.   Lennette Bihari, MD, [facility] 07/01/2023 4:45 PM

## 2023-07-02 ENCOUNTER — Inpatient Hospital Stay (HOSPITAL_BASED_OUTPATIENT_CLINIC_OR_DEPARTMENT_OTHER)
Admit: 2023-07-02 | Discharge: 2023-07-02 | Disposition: A | Discharge status: Home / Self Care | Payer: Medicare Other | Attending: Cardiovascular Disease | Admitting: Cardiovascular Disease

## 2023-07-02 ENCOUNTER — Other Ambulatory Visit (HOSPITAL_COMMUNITY): Payer: Self-pay

## 2023-07-02 ENCOUNTER — Other Ambulatory Visit: Payer: Self-pay | Admitting: Cardiology

## 2023-07-02 DIAGNOSIS — R000 Tachycardia, unspecified: Secondary | ICD-10-CM | POA: Diagnosis not present

## 2023-07-02 DIAGNOSIS — I2510 Atherosclerotic heart disease of native coronary artery without angina pectoris: Secondary | ICD-10-CM | POA: Diagnosis not present

## 2023-07-02 DIAGNOSIS — R7989 Other specified abnormal findings of blood chemistry: Secondary | ICD-10-CM | POA: Diagnosis not present

## 2023-07-02 DIAGNOSIS — E6609 Other obesity due to excess calories: Secondary | ICD-10-CM | POA: Diagnosis not present

## 2023-07-02 DIAGNOSIS — I10 Essential (primary) hypertension: Secondary | ICD-10-CM | POA: Diagnosis not present

## 2023-07-02 DIAGNOSIS — R002 Palpitations: Secondary | ICD-10-CM

## 2023-07-02 MED ORDER — METOPROLOL SUCCINATE ER 25 MG PO TB24
25.0000 mg | ORAL_TABLET | Freq: Every day | ORAL | 1 refills | Status: DC
  Filled 2023-07-02: qty 30, 30d supply, fill #0

## 2023-07-02 MED ORDER — LISINOPRIL 20 MG PO TABS
20.0000 mg | ORAL_TABLET | Freq: Every day | ORAL | 1 refills | Status: AC
  Filled 2023-07-02: qty 30, 30d supply, fill #0

## 2023-07-02 NOTE — Plan of Care (Signed)
  Problem: Education: Goal: Knowledge of General Education information will improve Description: Including pain rating scale, medication(s)/side effects and non-pharmacologic comfort measures 07/02/2023 1351 by Milderd Meager, RN Outcome: Adequate for Discharge 07/02/2023 0810 by Milderd Meager, RN Outcome: Progressing   Problem: Health Behavior/Discharge Planning: Goal: Ability to manage health-related needs will improve Outcome: Adequate for Discharge   Problem: Clinical Measurements: Goal: Ability to maintain clinical measurements within normal limits will improve Outcome: Adequate for Discharge Goal: Will remain free from infection Outcome: Adequate for Discharge Goal: Diagnostic test results will improve Outcome: Adequate for Discharge Goal: Respiratory complications will improve Outcome: Adequate for Discharge Goal: Cardiovascular complication will be avoided Outcome: Adequate for Discharge   Problem: Activity: Goal: Risk for activity intolerance will decrease 07/02/2023 1351 by Milderd Meager, RN Outcome: Adequate for Discharge 07/02/2023 0810 by Milderd Meager, RN Outcome: Progressing   Problem: Nutrition: Goal: Adequate nutrition will be maintained 07/02/2023 1351 by Milderd Meager, RN Outcome: Adequate for Discharge 07/02/2023 0810 by Milderd Meager, RN Outcome: Progressing   Problem: Coping: Goal: Level of anxiety will decrease Outcome: Adequate for Discharge   Problem: Elimination: Goal: Will not experience complications related to bowel motility Outcome: Adequate for Discharge Goal: Will not experience complications related to urinary retention Outcome: Adequate for Discharge   Problem: Pain Managment: Goal: General experience of comfort will improve 07/02/2023 1351 by Milderd Meager, RN Outcome: Adequate for Discharge 07/02/2023 0810 by Milderd Meager, RN Outcome: Progressing   Problem: Safety: Goal: Ability to remain free from injury will  improve 07/02/2023 1351 by Milderd Meager, RN Outcome: Adequate for Discharge 07/02/2023 0810 by Milderd Meager, RN Outcome: Progressing   Problem: Skin Integrity: Goal: Risk for impaired skin integrity will decrease Outcome: Adequate for Discharge

## 2023-07-02 NOTE — Progress Notes (Signed)
ZIO AT applied at hospital.  Dr. Tresa Endo to read.

## 2023-07-02 NOTE — Discharge Summary (Signed)
Physician Discharge Summary  Patient ID: DEMETRIS WHITESELL MRN: [id number] DOB/AGE: 06/03/1946 77 y.o.  Admit date: 07/01/2023 Discharge date: 07/02/2023  Admission Diagnoses:  Discharge Diagnoses:  Principal Problem:   Tachycardia Active Problems:   OBESITY   HYPERTENSION   Elevated troponin   Hyperglycemia   Leukocytosis   CAD (coronary artery disease)   Lactic acidosis   AKI (acute kidney injury) (HCC)   Hyperlipidemia   Discharged Condition: stable  Hospital Course: Patient is a 77 year old male with past medical history significant for hypertension, hyperlipidemia, PSVT, CAD, prostate cancer s/p robotic prostatectomy in 2016, anxiety, obesity, and OSA no longer on CPAP.  Patient was admitted with tachycardia.  Apparently, patient's smart watch noted a heart rate of 120 bpm.  Patient was admitted for further assessment and management.  Cardiology team was consulted to assist with patient's management.  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  Echocardiogram revealed grade 1 diastolic dysfunction, small pericardial effusion, mild dilatation of the aortic root measuring 44 mm and mild dilatation of the ascending aorta measuring 40 mm.  Patient was managed with beta blocker.  AKI noted on presentation, likely secondary to volume depletion.  Patient was adequately hydrated.  AKI has resolved.  A1c of 6.1% noted.  Patient will follow up with PCP and Cardiology on discharge.  Arrhythmia: History of paroxysmal SVT: -Acute on chronic.   -Patient presented with complaints of elevated heart rates on his smart watch into the 120s.   -EKG revealed sinus tachycardia with PVCs and bifascicular block.   -Patient with a prior history of SVT for which telemetry noted atrial tachycardia or atypical AVNRT back in 2011.   -Patient was previously on metoprolol succinate, but appeared to have been discontinued sometime after 2016. -Admit to a  cardiac telemetry bed -Started metoprolol succinate 25 mg daily -Telemetry monitoring -Cardiology consulted.  Cardiology directed care.   Elevated troponin: CAD: -High-sensitivity troponin 37->41.   -Patient with prior left heart cath in 12/2009 which noted normal coronaries and EF 55% for which prior Myoview stress test which noted mild to to moderate inferior ischemia was thought to be a false positive.   -Troponin elevation could be secondary to demand in setting of elevated heart rates with decreased kidney function. -Echo as documented below.   Leukocytosis: Lactic acidosis: -Acute.  On admission WBC 11.5 and initial lactic acid 2.   -Patient was noted to be afebrile and denied any infectious symptoms.   -Chest x-ray did not note any acute abnormality.   -Leukocytosis is likely reactive.    Acute kidney injury/Volume depletion: -Serum creatinine of 1.79 on presentation. -Serum creatinine of 1.14 prior to discharge.with BUN 38.  Baseline creatinine previously noted to be around 1 back in 2021.  -Patient was bolused 1 L of normal saline IV fluids and then placed on drip at 75 mL/h.   Hyperglycemia/Pre-daibetes: -Acute.  Glucose elevated at 173 on presentation.   -No prior history of diabetes noted. -Hemoglobin A1c - 6.1%   Essential hypertension -Continue to optimize.    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 Left ventricular ejection fraction, by estimation, is 60 to 65%. The  left ventricle has normal function. The left ventricle has no regional  wall motion abnormalities. Left ventricular diastolic parameters  are  consistent with Grade I diastolic  dysfunction (impaired relaxation).   2. Right ventricular systolic function is normal. The right ventricular  size is  normal. There is normal pulmonary artery systolic pressure.   3. A small pericardial effusion is present. The pericardial effusion is  anterior to the right ventricle.   4. The mitral valve is normal in structure. No evidence of mitral valve  regurgitation. No evidence of mitral stenosis.   5. The aortic valve is tricuspid. Aortic valve regurgitation is not  visualized. No aortic stenosis is present.   6. Aortic dilatation noted. There is mild dilatation of the aortic root,  measuring 44 mm. There is mild dilatation of the ascending aorta,  measuring 40 mm.   7. The inferior vena cava is normal in size with greater than 50%  respiratory variability, suggesting right atrial pressure of 3 mmHg.   Discharge Exam: Blood pressure 133/77, pulse 74, temperature (!) 97.5 F (36.4 C), temperature source Oral, resp. rate 18, height 6' (1.829 m), weight 123.5 kg, SpO2 96%.   Disposition: Discharge disposition: 01-Home or Self Care       Discharge Instructions     Diet - low sodium heart healthy   Complete by: As directed    Increase activity slowly   Complete by: As directed       Allergies as of 07/02/2023   No Known Allergies      Medication List     STOP taking these medications    lisinopril-hydrochlorothiazide 20-12.5 MG tablet Commonly known as: ZESTORETIC       TAKE these medications    atorvastatin 10 MG tablet Commonly known as: LIPITOR Take 10 mg by mouth daily.   dorzolamide-timolol 2-0.5 % ophthalmic solution Commonly known as: COSOPT Place 1 drop into both eyes daily.   latanoprost 0.005 % ophthalmic solution Commonly known as: XALATAN Place 1 drop into both eyes at bedtime.   lisinopril 20 MG tablet Commonly known as: ZESTRIL Take 1 tablet (20 mg total) by mouth daily.   metoprolol succinate 25 MG 24 hr tablet Commonly known as: TOPROL-XL Take 1 tablet (25 mg total) by mouth daily. Start taking on: July 03, 2023        Time spent: 71  Minutes.   SignedBarnetta Chapel 07/02/2023, 1:32 PM

## 2023-07-02 NOTE — Progress Notes (Signed)
Patient said he has his home medications with him and insisted he doesn't want it to be taken to the pharmacy nor have a family member take it home. He also stated that he is not going to take the medication while here on admission and that he only brought it here for the doctor to know what medication he is taking.

## 2023-07-02 NOTE — Progress Notes (Addendum)
Rounding Note    Patient Name: Anthony Glenn Date of Encounter: 07/02/2023  [facility] Cardiologist: None   Subjective   No complaints this morning.   Inpatient Medications    Scheduled Meds:  atorvastatin  10 mg Oral Daily   enoxaparin (LOVENOX) injection  40 mg Subcutaneous Daily   metoprolol succinate  25 mg Oral Daily   sodium chloride flush  3 mL Intravenous Q12H   Continuous Infusions:  sodium chloride 75 mL/hr at 07/02/23 0300   PRN Meds: acetaminophen **OR** acetaminophen, albuterol, ondansetron **OR** ondansetron (ZOFRAN) IV   Vital Signs    Vitals:   07/01/23 2347 07/02/23 0513 07/02/23 0759 07/02/23 0907  BP: 108/65 128/72 123/81 133/77  Pulse: 70 66 64 74  Resp: 20 19 18    Temp: (!) 97.5 F (36.4 C) (!) 97.5 F (36.4 C) (!) 97.5 F (36.4 C)   TempSrc: Oral Oral Oral   SpO2: 96% 97% 96%   Weight:      Height:        Intake/Output Summary (Last 24 hours) at 07/02/2023 1103 Last data filed at 07/02/2023 0300 Gross per 24 hour  Intake 1000 ml  Output --  Net 1000 ml      07/01/2023   10:22 PM 07/01/2023    2:43 AM 07/01/2023    1:04 AM  Last 3 Weights  Weight (lbs) 272 lb 4.8 oz 265 lb 265 lb  Weight (kg) 123.514 kg 120.203 kg 120.203 kg      Telemetry    Sinus Rhythm-->Bradycardia - Personally Reviewed  Physical Exam   GEN: No acute distress.   Neck: No JVD Cardiac: RRR, no murmurs, rubs, or gallops.  Respiratory: Clear to auscultation bilaterally. GI: Soft, nontender, non-distended  MS: No edema; No deformity. Neuro:  Nonfocal  Psych: Normal affect   Labs    High Sensitivity Troponin:   Recent Labs  Lab 07/01/23 0112 07/01/23 0244 07/01/23 2111  TROPONINIHS 37* 41* 20*     Chemistry Recent Labs  Lab 07/01/23 0112 07/01/23 0910 07/02/23 0047  NA 137  --  135  K 3.9  --  3.8  CL 104  --  103  CO2 19*  --  25  GLUCOSE 173*  --  114*  BUN 38*  --  27*  CREATININE 1.79*  --  1.14  CALCIUM 9.0  --  8.6*   MG  --  2.0  --   GFRNONAA 38*  --  >60  ANIONGAP 14  --  7    Lipids  Recent Labs  Lab 07/01/23 0244  CHOL 121  TRIG 101  HDL 30*  LDLCALC 71  CHOLHDL 4.0    Hematology Recent Labs  Lab 07/01/23 0112 07/02/23 0047  WBC 11.5* 9.3  RBC 5.19 4.61  HGB 15.7 14.0  HCT 47.9 42.9  MCV 92.3 93.1  MCH 30.3 30.4  MCHC 32.8 32.6  RDW 12.5 12.9  PLT 303 249   Thyroid  Recent Labs  Lab 07/01/23 0910  TSH 1.935    BNPNo results for input(s): "BNP", "PROBNP" in the last 168 hours.  DDimer  Recent Labs  Lab 07/01/23 0412  DDIMER 0.53*     Radiology    US RENAL  Result Date: 07/01/2023 CLINICAL DATA:  Acute kidney injury EXAM: RENAL / URINARY TRACT ULTRASOUND COMPLETE COMPARISON:  CT abdomen pelvis 10/02/2011 FINDINGS: Right Kidney: Renal measurements: 11.2 x 7.1 x 4.9 cm = volume: 203 mL. Echogenicity within normal limits.  No mass or hydronephrosis visualized. Left Kidney: Renal measurements: 4.8 x 6.5 x 5.9 cm = volume: 256 mL. Echogenicity within normal limits. No mass or hydronephrosis visualized. Bladder: Appears normal for degree of bladder distention. Other: None. IMPRESSION: No significant sonographic abnormality of the kidneys. Electronically Signed   By: Acquanetta Belling M.D.   On: 07/01/2023 10:33   DG Chest 2 View  Result Date: 07/01/2023 CLINICAL DATA:  Shortness of breath EXAM: CHEST - 2 VIEW COMPARISON:  12/08/2014 FINDINGS: The heart size and mediastinal contours are within normal limits. Both lungs are clear. The visualized skeletal structures are unremarkable. IMPRESSION: No active cardiopulmonary disease. Electronically Signed   By: Alcide Clever M.D.   On: 07/01/2023 01:31    Cardiac Studies   Echo: 07/01/2023  IMPRESSIONS     1. Left ventricular ejection fraction, by estimation, is 60 to 65%. The  left ventricle has normal function. The left ventricle has no regional  wall motion abnormalities. Left ventricular diastolic parameters are  consistent with  Grade I diastolic  dysfunction (impaired relaxation).   2. Right ventricular systolic function is normal. The right ventricular  size is normal. There is normal pulmonary artery systolic pressure.   3. A small pericardial effusion is present. The pericardial effusion is  anterior to the right ventricle.   4. The mitral valve is normal in structure. No evidence of mitral valve  regurgitation. No evidence of mitral stenosis.   5. The aortic valve is tricuspid. Aortic valve regurgitation is not  visualized. No aortic stenosis is present.   6. Aortic dilatation noted. There is mild dilatation of the aortic root,  measuring 44 mm. There is mild dilatation of the ascending aorta,  measuring 40 mm.   7. The inferior vena cava is normal in size with greater than 50%  respiratory variability, suggesting right atrial pressure of 3 mmHg.   Comparison(s): No prior Echocardiogram.   FINDINGS   Left Ventricle: Left ventricular ejection fraction, by estimation, is 60  to 65%. The left ventricle has normal function. The left ventricle has no  regional wall motion abnormalities. Definity contrast agent was given IV  to delineate the left ventricular   endocardial borders. The left ventricular internal cavity size was normal  in size. There is no left ventricular hypertrophy. Left ventricular  diastolic parameters are consistent with Grade I diastolic dysfunction  (impaired relaxation).   Right Ventricle: The right ventricular size is normal. No increase in  right ventricular wall thickness. Right ventricular systolic function is  normal. There is normal pulmonary artery systolic pressure. The tricuspid  regurgitant velocity is 1.88 m/s, and   with an assumed right atrial pressure of 3 mmHg, the estimated right  ventricular systolic pressure is 17.1 mmHg.   Left Atrium: Left atrial size was normal in size.   Right Atrium: Right atrial size was normal in size.   Pericardium: A small pericardial  effusion is present. The pericardial  effusion is anterior to the right ventricle. Presence of epicardial fat  layer.   Mitral Valve: The mitral valve is normal in structure. No evidence of  mitral valve regurgitation. No evidence of mitral valve stenosis.   Tricuspid Valve: The tricuspid valve is normal in structure. Tricuspid  valve regurgitation is not demonstrated. No evidence of tricuspid  stenosis.   Aortic Valve: The aortic valve is tricuspid. Aortic valve regurgitation is  not visualized. No aortic stenosis is present.   Pulmonic Valve: The pulmonic valve was normal in structure.  Pulmonic valve  regurgitation is not visualized. No evidence of pulmonic stenosis.   Aorta: Aortic dilatation noted. There is mild dilatation of the aortic  root, measuring 44 mm. There is mild dilatation of the ascending aorta,  measuring 40 mm.   Venous: The inferior vena cava is normal in size with greater than 50%  respiratory variability, suggesting right atrial pressure of 3 mmHg.   IAS/Shunts: No atrial level shunt detected by color flow Doppler.     Patient Profile     77 y.o. 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 Katrinka Blazing.   Assessment & Plan    Sinus tachycardia History of SVT -- Has been in sinus rhythm on telemetry, no recurrent episodes.  Echocardiogram showed LVEF of 60 to 65%, no regional wall motion abnormality, grade 1 diastolic dysfunction, normal RV, small pericardial effusion.  Suspect there may have been a component of dehydration.  He takes lisinopril HCTZ as an outpatient.  Would recommend discontinuing HCTZ component at discharge. -- Continue Toprol XL 25 mg daily -- Will place cardiac monitor prior to discharge  Elevated troponin -- High-sensitivity troponin 37>> 41>> 20 -- Has not had any chest pain, suspect demand from elevated  heart rate  HTN -- Well-controlled -- Continue Toprol XL 25 mg daily, lisinopril. As above, stop HCTZ  Per primary AKI Leukocytosis   For questions or updates, please contact [facility] Please consult [email or web address] for contact info under        Signed, Laverda Page, NP  07/02/2023, 11:03 AM      Patient seen and examined. Agree with assessment and plan.  Patient feels well.  He has been maintaining sinus rhythm with no ectopy.  A 30-day monitor has been placed.  Plan to continue metoprolol succinate 25 mg and change lisinopril HCT is to just lisinopril alone and discontinue hydrochlorothiazide since there is no edema.  Okay for discharge today.  Patient has initial follow-up evaluation determined for to see Edd Fabian, NP in our office.   Lennette Bihari, MD, [facility] 07/02/2023 2:22 PM

## 2023-07-02 NOTE — TOC CM/SW Note (Signed)
Transition of Care [facility]) - Inpatient Brief Assessment   Patient Details  Name: Anthony Glenn MRN: [id number] Date of Birth: [birth year]  Transition of Care [facility]) CM/SW Contact:    Gala Lewandowsky, RN Phone Number: 07/02/2023, 12:39 PM   Clinical Narrative: Patient presented for tachycardia. PTA patient was independent from home. No home needs identified at this time. Case Manager will continue to follow for transition of care needs as the patient progresses.    Transition of Care Asessment: Insurance and Status: Insurance coverage has been reviewed Patient has primary care physician: Yes Home environment has been reviewed: reviewed Prior level of function:: independent Prior/Current Home Services: No current home services Social Determinants of Health Reivew: SDOH reviewed no interventions necessary Readmission risk has been reviewed: Yes Transition of care needs: no transition of care needs at this time

## 2023-07-02 NOTE — Care Management Obs Status (Signed)
MEDICARE OBSERVATION STATUS NOTIFICATION   Patient Details  Name: Anthony Glenn MRN: [id number] Date of Birth: [birth year]   Medicare Observation Status Notification Given:  Yes    Gala Lewandowsky, RN 07/02/2023, 12:38 PM

## 2023-07-02 NOTE — Plan of Care (Signed)

## 2023-07-02 NOTE — Plan of Care (Signed)

## 2023-07-09 DIAGNOSIS — R002 Palpitations: Secondary | ICD-10-CM | POA: Diagnosis not present

## 2023-07-14 ENCOUNTER — Telehealth: Payer: Self-pay | Admitting: Cardiovascular Disease

## 2023-07-14 NOTE — Telephone Encounter (Signed)
Patient is calling because he is wearing a heart monitor. Patient sent the original heart monitor back because it was falling off. Patient put the second monitor on last Tuesday. Patient would like to know if he should keep his appointment on 09/05 or if he should move it back for [place] to receive the heart monitor results. Patient requested if he does not answer for [place] to leave a detailed VM. Please advise.

## 2023-07-14 NOTE — Telephone Encounter (Signed)
Advised patient his appt is hospital F/U as well.  Advised to keep appt and the monitor results can be called to him once received.  He states understanding

## 2023-07-23 NOTE — Progress Notes (Unsigned)
Cardiology Office Note:  .   Date:  07/24/2023  ID:  Anthony Glenn, DOB 1946-06-06, MRN [id number] PCP: Merlene Laughter, MD (Inactive)  [facility] Providers Cardiologist:  Nicki Guadalajara, MD    History of Present Illness: .   Anthony Glenn is a 77 y.o. male  with a past medical history of HTN, SVT atrial tachycardia or atypical AVNRT previously placed on BB with a normal LHC in 2011.  He was seen in hospital on 07/01/2023 for evaluation of tachycardia.  In 2011 he was seen by Dr. [name] following an episode of SVT either atrial tachycardia or atypical AVNRT, he was started on beta-blocker therapy at that time.  He had a cardiac cath in 2011 following an abnormal stress test and mildly elevated troponin with SVT was found to have normal coronary arteries.  He was last seen by cardiology in 2011.  On 07/01/2023 he presented to the emergency room after his smart watch alarm for increased heart rate, he had been not cardiac aware.  He had been working outside and became anxious after being notified of increased heart rate he then noted shortness of breath, he denied chest pain.  Had apparently been off of beta-blocker therapy for several years.  In the ED he was given 1 L of IV fluids as he was hypotensive on admission at 95/79. HS Troponin 37>>41>>20, believed to be demand from elevated heart rate.  Echo during admission indicated LVEF of 60 to 65%, LV with normal function, no RWMA, G1 DD, he was noted to have a small pericardial effusion, he had mild dilation of the aortic root at 44 mm, as well as mild dilation of the ascending aorta at 40 mm.  It was recommended that he start Toprol XL 25 mg daily, continue his lisinopril and stop HCTZ.  He was discharged in stable condition on 07/02/2023 with a cardiac monitor.   Today he presents for follow-up.  He reports that he is doing well, he denies further palpitations or fast heart rates.  He monitors his heart rate with his Apple Watch.  He reports  that he also monitors his blood pressure at home and has been averaging less than 130/80 since discontinuation of his hydrochlorothiazide.  He has been working to stay well-hydrated following his admission.  He denies any chest pain, shortness of breath or lower extremity edema.  ROS: Today he denies chest pain, shortness of breath, lower extremity edema, fatigue, palpitations, melena, hematuria, hemoptysis, diaphoresis, weakness, presyncope, syncope, orthopnea, and PND.   Studies Reviewed: .       Cardiac Studies & Procedures       ECHOCARDIOGRAM  ECHOCARDIOGRAM COMPLETE 07/01/2023  Narrative ECHOCARDIOGRAM REPORT    Patient Name:   Anthony Glenn Date of Exam: 07/01/2023 Medical Rec #:  [id number]       Height:       72.0 in Accession #:    [id number]      Weight:       265.0 lb Date of Birth:  07/10/1946       BSA:          2.401 m Patient Age:    77 years        BP:           168/67 mmHg Patient Gender: M               HR:           73 bpm. Exam Location:  Inpatient  Procedure: 2D Echo, Cardiac Doppler, Color Doppler and Intracardiac Opacification Agent  Indications:    Elevated Troponin  History:        Patient has no prior history of Echocardiogram examinations. Risk Factors:Hypertension, Dyslipidemia and Sleep Apnea.  Sonographer:    Eulah Pont RDCS Referring Phys: [id number] RONDELL A SMITH  IMPRESSIONS   1. Left ventricular ejection fraction, by estimation, is 60 to 65%. The left ventricle has normal function. The left ventricle has no regional wall motion abnormalities. Left ventricular diastolic parameters are consistent with Grade I diastolic dysfunction (impaired relaxation). 2. Right ventricular systolic function is normal. The right ventricular size is normal. There is normal pulmonary artery systolic pressure. 3. A small pericardial effusion is present. The pericardial effusion is anterior to the right ventricle. 4. The mitral valve is normal in structure.  No evidence of mitral valve regurgitation. No evidence of mitral stenosis. 5. The aortic valve is tricuspid. Aortic valve regurgitation is not visualized. No aortic stenosis is present. 6. Aortic dilatation noted. There is mild dilatation of the aortic root, measuring 44 mm. There is mild dilatation of the ascending aorta, measuring 40 mm. 7. The inferior vena cava is normal in size with greater than 50% respiratory variability, suggesting right atrial pressure of 3 mmHg.  Comparison(s): No prior Echocardiogram.  FINDINGS Left Ventricle: Left ventricular ejection fraction, by estimation, is 60 to 65%. The left ventricle has normal function. The left ventricle has no regional wall motion abnormalities. Definity contrast agent was given IV to delineate the left ventricular endocardial borders. The left ventricular internal cavity size was normal in size. There is no left ventricular hypertrophy. Left ventricular diastolic parameters are consistent with Grade I diastolic dysfunction (impaired relaxation).  Right Ventricle: The right ventricular size is normal. No increase in right ventricular wall thickness. Right ventricular systolic function is normal. There is normal pulmonary artery systolic pressure. The tricuspid regurgitant velocity is 1.88 m/s, and with an assumed right atrial pressure of 3 mmHg, the estimated right ventricular systolic pressure is 17.1 mmHg.  Left Atrium: Left atrial size was normal in size.  Right Atrium: Right atrial size was normal in size.  Pericardium: A small pericardial effusion is present. The pericardial effusion is anterior to the right ventricle. Presence of epicardial fat layer.  Mitral Valve: The mitral valve is normal in structure. No evidence of mitral valve regurgitation. No evidence of mitral valve stenosis.  Tricuspid Valve: The tricuspid valve is normal in structure. Tricuspid valve regurgitation is not demonstrated. No evidence of tricuspid  stenosis.  Aortic Valve: The aortic valve is tricuspid. Aortic valve regurgitation is not visualized. No aortic stenosis is present.  Pulmonic Valve: The pulmonic valve was normal in structure. Pulmonic valve regurgitation is not visualized. No evidence of pulmonic stenosis.  Aorta: Aortic dilatation noted. There is mild dilatation of the aortic root, measuring 44 mm. There is mild dilatation of the ascending aorta, measuring 40 mm.  Venous: The inferior vena cava is normal in size with greater than 50% respiratory variability, suggesting right atrial pressure of 3 mmHg.  IAS/Shunts: No atrial level shunt detected by color flow Doppler.   LEFT VENTRICLE PLAX 2D LVIDd:         4.70 cm      Diastology LVIDs:         3.30 cm      LV e' medial:    5.43 cm/s LV PW:         1.00 cm  LV E/e' medial:  11.4 LV IVS:        1.00 cm      LV e' lateral:   8.79 cm/s LVOT diam:     2.30 cm      LV E/e' lateral: 7.1 LV SV:         88 LV SV Index:   37 LVOT Area:     4.15 cm  LV Volumes (MOD) LV vol d, MOD A2C: 61.4 ml LV vol d, MOD A4C: 106.0 ml LV vol s, MOD A2C: 22.1 ml LV vol s, MOD A4C: 42.9 ml LV SV MOD A2C:     39.3 ml LV SV MOD A4C:     106.0 ml LV SV MOD BP:      48.3 ml  RIGHT VENTRICLE RV S prime:     11.70 cm/s TAPSE (M-mode): 1.6 cm  LEFT ATRIUM             Index        RIGHT ATRIUM           Index LA diam:        3.60 cm 1.50 cm/m   RA Area:     17.00 cm LA Vol (A2C):   47.0 ml 19.57 ml/m  RA Volume:   39.40 ml  16.41 ml/m LA Vol (A4C):   47.4 ml 19.74 ml/m LA Biplane Vol: 49.0 ml 20.41 ml/m AORTIC VALVE LVOT Vmax:   110.00 cm/s LVOT Vmean:  73.400 cm/s LVOT VTI:    0.211 m  AORTA Ao Root diam: 4.40 cm Ao Asc diam:  4.00 cm  MITRAL VALVE                TRICUSPID VALVE MV Area (PHT): 2.29 cm     TR Peak grad:   14.1 mmHg MV Decel Time: 331 msec     TR Vmax:        188.00 cm/s MV E velocity: 62.00 cm/s MV A velocity: 100.00 cm/s  SHUNTS MV E/A ratio:   0.62         Systemic VTI:  0.21 m Systemic Diam: 2.30 cm  Riley Lam MD Electronically signed by Riley Lam MD Signature Date/Time: 07/01/2023/4:51:42 PM    Final                      Physical Exam:   VS:  BP 124/80   Pulse 65   Ht 6' (1.829 m)   Wt 266 lb 9.6 oz (120.9 kg)   SpO2 96%   BMI 36.16 kg/m    Wt Readings from Last 3 Encounters:  07/24/23 266 lb 9.6 oz (120.9 kg)  07/01/23 272 lb 4.8 oz (123.5 kg)  04/06/20 227 lb 15.3 oz (103.4 kg)    GEN: Well nourished, well developed in no acute distress NECK: No JVD; No carotid bruits CARDIAC: RRR, no murmurs, rubs, gallops RESPIRATORY:  Clear to auscultation without rales, wheezing or rhonchi  ABDOMEN: Soft, non-tender, non-distended EXTREMITIES:  No edema; No deformity   ASSESSMENT AND PLAN: .    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  At some point his beta-blocker was discontinued.  He presented to the emergency room on 07/01/2023 for tachycardia.  Was given fluids, his HCTZ was discontinued and he was started on Toprol 25 mg daily. Echocardiogram showed LVEF of 60 to 65%, no regional wall motion abnormality,  grade 1 diastolic dysfunction, normal RV, small pericardial effusion. He has mail back his cardiac monitor, results not available yet.  Today he denies further palpitations or increased heart rate he has been monitoring on his Apple watch.  Discussed potential triggers such as dehydration, caffeine, sleep apnea, electrolyte imbalances or alcohol intake.  Continue Toprol XL 25 mg daily  HTN: Blood pressure today initially 128/84, on recheck was 124/80.  He reports similar readings at home.  Continue lisinopril and Toprol.  Hyperlipidemia: Lipid profile on 07/01/2023 showed total cholesterol of 121, triglycerides of 101, and LDL of 71.  Continue atorvastatin.   Pericardial effusion: Noted  to have a small pericardial effusion on echo during admission.  Will have him repeat a limited echo in 3 months for monitoring.  OSA: He reports a history of sleep apnea, does not wear CPAP, was unable to tolerate it.  He plans to speak with his dentist as he has a soon appointment regarding a potential device.  He is not interested in repeat sleep study at this time.       Dispo: Follow-up with Dr. Tresa Endo in 6 months.  Signed, Rip Harbour, NP

## 2023-07-24 ENCOUNTER — Ambulatory Visit: Payer: Medicare Other | Attending: General Practice | Admitting: Cardiology

## 2023-07-24 ENCOUNTER — Encounter: Payer: Self-pay | Admitting: General Practice

## 2023-07-24 VITALS — BP 124/80 | HR 65 | Ht 72.0 in | Wt 266.6 lb

## 2023-07-24 DIAGNOSIS — R002 Palpitations: Secondary | ICD-10-CM | POA: Diagnosis not present

## 2023-07-24 DIAGNOSIS — I3139 Other pericardial effusion (noninflammatory): Secondary | ICD-10-CM

## 2023-07-24 DIAGNOSIS — G4733 Obstructive sleep apnea (adult) (pediatric): Secondary | ICD-10-CM

## 2023-07-24 DIAGNOSIS — R000 Tachycardia, unspecified: Secondary | ICD-10-CM | POA: Diagnosis not present

## 2023-07-24 DIAGNOSIS — I10 Essential (primary) hypertension: Secondary | ICD-10-CM

## 2023-07-24 NOTE — Patient Instructions (Addendum)
Medication Instructions:  The current medical regimen is effective;  continue present plan and medications as directed. Please refer to the Current Medication list given to you today.  *If you need a refill on your cardiac medications before your next appointment, please call your pharmacy*  Lab Work: NONE If you have labs (blood work) drawn today and your tests are completely normal, you will receive your results only by:  MyChart Message (if you have MyChart) OR  A paper copy in the mail If you have any lab test that is abnormal or we need to change your treatment, we will call you to review the results.  Testing/Procedures: Your physician has requested that you have a limited echocardiogram. Limited Echocardiography is a painless test that uses sound waves to create images of your heart. It provides your doctor with information about the size and shape of your heart and how well your heart's chambers and valves are working. This procedure takes approximately one hour. There are no restrictions for this procedure. Please do NOT wear cologne, perfume, aftershave, or lotions (deodorant is allowed). Please arrive 15 minutes prior to your appointment time.   Follow-Up: At [facility], you and your health needs are our priority.  As part of our continuing mission to provide you with exceptional heart care, we have created designated Provider Care Teams.  These Care Teams include your primary Cardiologist (physician) and Advanced Practice Providers (APPs -  Physician Assistants and Nurse Practitioners) who all work together to provide you with the care you need, when you need it.  Your next appointment:   6 month(s)  Provider:   Nicki Guadalajara, MD only     Please try to avoid these triggers: Do not use any products that have nicotine or tobacco in them. These include cigarettes, e-cigarettes, and chewing tobacco. If you need help quitting, ask your doctor. Eat heart-healthy foods. Talk  with your doctor about the right eating plan for you. Exercise regularly as told by your doctor. Stay hydrated Do not drink alcohol, Caffeine or chocolate. Lose weight if you are overweight. Do not use drugs, including cannabis

## 2023-07-28 NOTE — Addendum Note (Signed)
Encounter addended by: Andee Lineman A on: 07/28/2023 5:59 PM  Actions taken: Imaging Exam ended

## 2023-07-31 DIAGNOSIS — I10 Essential (primary) hypertension: Secondary | ICD-10-CM | POA: Diagnosis not present

## 2023-07-31 DIAGNOSIS — I471 Supraventricular tachycardia, unspecified: Secondary | ICD-10-CM | POA: Diagnosis not present

## 2023-07-31 DIAGNOSIS — E7800 Pure hypercholesterolemia, unspecified: Secondary | ICD-10-CM | POA: Diagnosis not present

## 2023-07-31 DIAGNOSIS — R7303 Prediabetes: Secondary | ICD-10-CM | POA: Diagnosis not present

## 2023-09-02 DIAGNOSIS — H401131 Primary open-angle glaucoma, bilateral, mild stage: Secondary | ICD-10-CM | POA: Diagnosis not present

## 2023-10-02 DIAGNOSIS — D1801 Hemangioma of skin and subcutaneous tissue: Secondary | ICD-10-CM | POA: Diagnosis not present

## 2023-10-02 DIAGNOSIS — Z85820 Personal history of malignant melanoma of skin: Secondary | ICD-10-CM | POA: Diagnosis not present

## 2023-10-02 DIAGNOSIS — D225 Melanocytic nevi of trunk: Secondary | ICD-10-CM | POA: Diagnosis not present

## 2023-10-02 DIAGNOSIS — D692 Other nonthrombocytopenic purpura: Secondary | ICD-10-CM | POA: Diagnosis not present

## 2023-10-02 DIAGNOSIS — L821 Other seborrheic keratosis: Secondary | ICD-10-CM | POA: Diagnosis not present

## 2023-10-02 DIAGNOSIS — L910 Hypertrophic scar: Secondary | ICD-10-CM | POA: Diagnosis not present

## 2023-10-02 DIAGNOSIS — D2261 Melanocytic nevi of right upper limb, including shoulder: Secondary | ICD-10-CM | POA: Diagnosis not present

## 2023-10-24 ENCOUNTER — Ambulatory Visit (HOSPITAL_COMMUNITY): Payer: Medicare Other | Attending: Cardiology

## 2023-10-24 DIAGNOSIS — I3139 Other pericardial effusion (noninflammatory): Secondary | ICD-10-CM | POA: Insufficient documentation

## 2023-10-24 LAB — ECHOCARDIOGRAM LIMITED
Area-P 1/2: 3.26 cm2
S' Lateral: 2.9 cm

## 2023-10-28 ENCOUNTER — Telehealth: Payer: Self-pay

## 2023-10-28 DIAGNOSIS — I77819 Aortic ectasia, unspecified site: Secondary | ICD-10-CM

## 2023-10-28 DIAGNOSIS — I3139 Other pericardial effusion (noninflammatory): Secondary | ICD-10-CM

## 2023-10-28 NOTE — Telephone Encounter (Signed)
-----   Message from Rip Harbour sent at 10/28/2023  2:48 PM EST ----- Please let Anthony Glenn know his echocardiogram shows that his heart squeeze or ejection fraction is low normal. There is some stiffening of the left lower chamber that may be slightly worse compared to his prior echo. His pericardial effusion is now trace which is an improvement compared to his prior. Please ask if he has had any chest pain, shortness of breath or lower extremity edema, if yes would recommend follow up appointment. If no would recommend repeat limited echo in three months. There is also evidence that his aorta is dilated or slightly enlarged, would recommend a CTA chest/aorta with BMET before for further imaging.

## 2023-10-28 NOTE — Telephone Encounter (Signed)
Left message to call back  

## 2023-10-28 NOTE — Telephone Encounter (Signed)
Called patient advised of below they verbalized understanding.

## 2023-10-28 NOTE — Telephone Encounter (Signed)
Patient is returning call.  °

## 2023-10-30 ENCOUNTER — Telehealth (HOSPITAL_BASED_OUTPATIENT_CLINIC_OR_DEPARTMENT_OTHER): Payer: Self-pay | Admitting: Cardiology

## 2023-11-07 DIAGNOSIS — I77819 Aortic ectasia, unspecified site: Secondary | ICD-10-CM | POA: Diagnosis not present

## 2023-11-07 DIAGNOSIS — I3139 Other pericardial effusion (noninflammatory): Secondary | ICD-10-CM | POA: Diagnosis not present

## 2023-11-07 LAB — BASIC METABOLIC PANEL
BUN/Creatinine Ratio: 22 (ref 10–24)
BUN: 24 mg/dL (ref 8–27)
CO2: 23 mmol/L (ref 20–29)
Calcium: 8.9 mg/dL (ref 8.6–10.2)
Chloride: 103 mmol/L (ref 96–106)
Creatinine, Ser: 1.07 mg/dL (ref 0.76–1.27)
Glucose: 112 mg/dL — ABNORMAL HIGH (ref 70–99)
Potassium: 4.5 mmol/L (ref 3.5–5.2)
Sodium: 140 mmol/L (ref 134–144)
eGFR: 71 mL/min/{1.73_m2} (ref >59)

## 2023-11-18 ENCOUNTER — Ambulatory Visit (HOSPITAL_BASED_OUTPATIENT_CLINIC_OR_DEPARTMENT_OTHER)
Admission: RE | Admit: 2023-11-18 | Discharge: 2023-11-18 | Disposition: A | Discharge status: Home / Self Care | Payer: Medicare Other | Source: Ambulatory Visit | Attending: Cardiology | Admitting: Cardiology

## 2023-11-18 DIAGNOSIS — I77819 Aortic ectasia, unspecified site: Secondary | ICD-10-CM | POA: Diagnosis not present

## 2023-11-18 DIAGNOSIS — I3139 Other pericardial effusion (noninflammatory): Secondary | ICD-10-CM | POA: Insufficient documentation

## 2023-11-18 DIAGNOSIS — I719 Aortic aneurysm of unspecified site, without rupture: Secondary | ICD-10-CM | POA: Diagnosis not present

## 2023-11-18 DIAGNOSIS — K760 Fatty (change of) liver, not elsewhere classified: Secondary | ICD-10-CM | POA: Diagnosis not present

## 2023-11-18 DIAGNOSIS — I700 Atherosclerosis of aorta: Secondary | ICD-10-CM | POA: Diagnosis not present

## 2023-11-18 MED ORDER — IOHEXOL 350 MG/ML SOLN
100.0000 mL | Freq: Once | INTRAVENOUS | Status: AC | PRN
  Administered 2023-11-18: 80 mL via INTRAVENOUS

## 2023-11-25 DIAGNOSIS — Z79899 Other long term (current) drug therapy: Secondary | ICD-10-CM | POA: Diagnosis not present

## 2023-11-25 DIAGNOSIS — I700 Atherosclerosis of aorta: Secondary | ICD-10-CM | POA: Diagnosis not present

## 2023-11-25 DIAGNOSIS — Z23 Encounter for immunization: Secondary | ICD-10-CM | POA: Diagnosis not present

## 2023-11-25 DIAGNOSIS — I77810 Thoracic aortic ectasia: Secondary | ICD-10-CM | POA: Diagnosis not present

## 2023-11-25 DIAGNOSIS — R7303 Prediabetes: Secondary | ICD-10-CM | POA: Diagnosis not present

## 2023-11-25 DIAGNOSIS — I10 Essential (primary) hypertension: Secondary | ICD-10-CM | POA: Diagnosis not present

## 2023-11-25 DIAGNOSIS — I471 Supraventricular tachycardia, unspecified: Secondary | ICD-10-CM | POA: Diagnosis not present

## 2023-11-25 DIAGNOSIS — E7800 Pure hypercholesterolemia, unspecified: Secondary | ICD-10-CM | POA: Diagnosis not present

## 2023-11-25 DIAGNOSIS — I2510 Atherosclerotic heart disease of native coronary artery without angina pectoris: Secondary | ICD-10-CM | POA: Diagnosis not present

## 2023-11-25 DIAGNOSIS — E041 Nontoxic single thyroid nodule: Secondary | ICD-10-CM | POA: Diagnosis not present

## 2023-11-25 DIAGNOSIS — Z0000 Encounter for general adult medical examination without abnormal findings: Secondary | ICD-10-CM | POA: Diagnosis not present

## 2023-11-26 ENCOUNTER — Telehealth: Payer: Self-pay

## 2023-11-26 NOTE — Telephone Encounter (Signed)
-----   Message from Katlyn D West sent at 11/24/2023  9:25 PM EST ----- Please let Mr. 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 Please forward results to his primary care provider.

## 2023-11-26 NOTE — Telephone Encounter (Signed)
 Left message to call back

## 2023-11-27 NOTE — Telephone Encounter (Signed)
 Pt returning call

## 2023-11-28 NOTE — Telephone Encounter (Signed)
 Patient is returning call.

## 2023-11-28 NOTE — Telephone Encounter (Signed)
 Called patient advised of below they verbalized understanding.

## 2023-12-04 ENCOUNTER — Telehealth: Payer: Self-pay | Admitting: Cardiovascular Disease

## 2023-12-04 NOTE — Telephone Encounter (Signed)
 Called patient with no answer. Left message to call back

## 2023-12-04 NOTE — Telephone Encounter (Signed)
  Per answering service:  Patient returning call that he received from office, not sure who called

## 2023-12-05 DIAGNOSIS — E041 Nontoxic single thyroid nodule: Secondary | ICD-10-CM | POA: Diagnosis not present

## 2023-12-05 NOTE — Telephone Encounter (Signed)
 2nd attempt to call patient, no answer left message requesting a call back.

## 2023-12-08 NOTE — Telephone Encounter (Signed)
 3rd attempt to call patient, no answer, left message requesting a call back Nursing will await for patient to return call

## 2024-01-23 ENCOUNTER — Encounter (HOSPITAL_BASED_OUTPATIENT_CLINIC_OR_DEPARTMENT_OTHER): Payer: Self-pay

## 2024-01-26 ENCOUNTER — Ambulatory Visit: Payer: Medicare Other | Admitting: Cardiovascular Disease

## 2024-01-26 ENCOUNTER — Ambulatory Visit (HOSPITAL_COMMUNITY)
Admission: RE | Admit: 2024-01-26 | Discharge: 2024-01-26 | Disposition: A | Discharge status: Home / Self Care | Payer: Medicare Other | Source: Ambulatory Visit | Attending: Cardiology | Admitting: Cardiology

## 2024-01-26 DIAGNOSIS — I3139 Other pericardial effusion (noninflammatory): Secondary | ICD-10-CM | POA: Diagnosis not present

## 2024-01-26 LAB — ECHOCARDIOGRAM LIMITED
Area-P 1/2: 3.17 cm2
MV M vel: 4.25 m/s
MV Peak grad: 72.3 mmHg
S' Lateral: 3.3 cm

## 2024-01-27 ENCOUNTER — Encounter: Payer: Self-pay | Admitting: Cardiovascular Disease

## 2024-02-06 ENCOUNTER — Telehealth: Payer: Self-pay

## 2024-02-06 NOTE — Telephone Encounter (Signed)
 Left message to call back

## 2024-02-06 NOTE — Telephone Encounter (Signed)
-----   Message from Rip Harbour sent at 01/29/2024  1:05 PM EDT ----- Please let Anthony Glenn know his echocardiogram showed normal heart squeeze and function.  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 Pericardial effusion remains trivial. There is mild dilation of the asending aorta that is stable. Overall good results!  Dr. Tresa Endo will review the results with you further at follow-up on 02/11/24.

## 2024-02-09 NOTE — Telephone Encounter (Signed)
 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 Patient wanted these questions noted for appointment on the 26th with Dr. Tresa Endo advised patient to write down any further questions if he has more. Patient would like to wait to talk to Dr. Tresa Endo to get his answers directly from him.

## 2024-02-09 NOTE — Telephone Encounter (Signed)
Follow Up:     Patient is returning a call from Friday.

## 2024-02-11 ENCOUNTER — Ambulatory Visit: Attending: Cardiovascular Disease | Admitting: Cardiovascular Disease

## 2024-02-11 ENCOUNTER — Other Ambulatory Visit (HOSPITAL_COMMUNITY): Payer: Self-pay

## 2024-02-11 ENCOUNTER — Encounter: Payer: Self-pay | Admitting: Cardiovascular Disease

## 2024-02-11 VITALS — BP 128/76 | HR 63 | Ht 72.0 in | Wt 271.6 lb

## 2024-02-11 DIAGNOSIS — I452 Bifascicular block: Secondary | ICD-10-CM

## 2024-02-11 DIAGNOSIS — I471 Supraventricular tachycardia, unspecified: Secondary | ICD-10-CM | POA: Diagnosis not present

## 2024-02-11 DIAGNOSIS — R002 Palpitations: Secondary | ICD-10-CM

## 2024-02-11 DIAGNOSIS — I10 Essential (primary) hypertension: Secondary | ICD-10-CM

## 2024-02-11 DIAGNOSIS — I77810 Thoracic aortic ectasia: Secondary | ICD-10-CM

## 2024-02-11 DIAGNOSIS — I358 Other nonrheumatic aortic valve disorders: Secondary | ICD-10-CM | POA: Diagnosis not present

## 2024-02-11 DIAGNOSIS — G4733 Obstructive sleep apnea (adult) (pediatric): Secondary | ICD-10-CM

## 2024-02-11 DIAGNOSIS — I2510 Atherosclerotic heart disease of native coronary artery without angina pectoris: Secondary | ICD-10-CM

## 2024-02-11 MED ORDER — METOPROLOL SUCCINATE ER 25 MG PO TB24
37.5000 mg | ORAL_TABLET | Freq: Every morning | ORAL | 1 refills | Status: AC
  Filled 2024-02-11 (×2): qty 30, 20d supply, fill #0

## 2024-02-11 NOTE — Progress Notes (Unsigned)
 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 Patient wanted these questions noted for appointment on the 26th with Dr. Tresa Endo advised patient to write down any further questions if he has more. Patient would like to wait to talk to Dr. Tresa Endo to get his answers directly from him.

## 2024-02-11 NOTE — Patient Instructions (Signed)
 Medication Instructions:  Take Metoprolol Succinate 37.5mg  every morning.   If you're still having increased heart rate, you may take 2 tablets (50mg ) as needed.   *If you need a refill on your cardiac medications before your next appointment, please call your pharmacy*   Lab Work: No labs were ordered during today's visit.  If you have labs (blood work) drawn today and your tests are completely normal, you will receive your results only by: MyChart Message (if you have MyChart) OR A paper copy in the mail If you have any lab test that is abnormal or we need to change your treatment, we will call you to review the results.   Testing/Procedures: No procedures were ordered during today's visit.    Follow-Up: At [facility], you and your health needs are our priority.  As part of our continuing mission to provide you with exceptional heart care, we have created designated Provider Care Teams.  These Care Teams include your primary Cardiologist (physician) and Advanced Practice Providers (APPs -  Physician Assistants and Nurse Practitioners) who all work together to provide you with the care you need, when you need it.  We recommend signing up for the patient portal called "MyChart".  Sign up information is provided on this After Visit Summary.  MyChart is used to connect with patients for Virtual Visits (Telemedicine).  Patients are able to view lab/test results, encounter notes, upcoming appointments, etc.  Non-urgent messages can be sent to your provider as well.   To learn more about what you can do with MyChart, go to [email or web address].    Your next appointment:   8 month(s)  Provider:   Dr. Epifanio Lesches     Other Instructions [facility]  [address] [place], [place] [address] [address] [phone]       1st Floor: - Lobby - Registration  - Pharmacy  - Lab - Cafe   2nd Floor: - PV Lab - Diagnostic Testing (echo, CT,  nuclear med)   3rd Floor: - Vacant   4th Floor: - TCTS (cardiothoracic surgery) - AFib Clinic - Structural Heart Clinic - Vascular Surgery  - Vascular Ultrasound   5th Floor: - [facility] (general and EP) - Clinical Pharmacy for coumadin, hypertension, lipid, weight-loss medications, and med management appointments      Valet parking services will be available as well.

## 2024-02-13 ENCOUNTER — Encounter: Payer: Self-pay | Admitting: Cardiovascular Disease

## 2024-02-13 NOTE — Progress Notes (Signed)
 Cardiology Office Note    Date:  02/13/2024   ID:  Anthony Glenn, Anthony Glenn 1946/01/23, MRN [id number]  PCP:  Katha Cabal, MD  Cardiologist:  Nicki Guadalajara, MD   Initial office visit with me   History of Present Illness:  Anthony Glenn is a 78 y.o. male who remotely had seen Dr. [name] in 2011 following an episode of SVT, felt either to be atrial tachycardia or atypical AV nodal reentrant tachycardia.  He was started on beta-blocker at that time.  Cardiac catheterization was done following an abnormal stress test and he was found to have normal coronary arteries.  He presented to the emergency room in July 01, 2023 after his smart watch alarmed him for increased heart rate which he was unaware.  He subsequently became anxious, noticed shortness of breath.  At the time he was no longer taking beta-blocker therapy and he presented to the emergency room where he was felt to be dehydrated and was treated with IV fluids.  Troponins were minimally increased felt to be due to demand ischemia from elevated heart rates.  An echo Doppler study showed EF at 60 to 65% with normal LV and RV function.  There was grade 1 diastolic dysfunction.  He was found to have a small pericardial effusion.  There was mild dilation of his aortic root at 44 mm and ascending aorta measured 40 mm.  He was started on Toprol-XL 25 mg and was told to continue his lisinopril and stop HCTZ.  I had seen him for cardiology consultation on July 01, 2023 in the hospital and he was kept overnight for observation.  Subsequent to his hospitalization, he was seen by Reather Littler, NP on July 24, 2023.  He had worn a heart monitor.  At follow-up he denied any further palpitations or increased heart rate and was maintained on Toprol XL 25 mg in addition to lisinopril as well for his blood pressure.  At that time he reported a history of sleep apnea but was not using CPAP and was to see a dentist for possible oral  appliance.  He had worn a cardiac monitor which showed predominant sinus rhythm with bundle branch block/IVCD.  He had 63 short burst of SVT over 15-day.  With the fastest interval lasting 12 beats at a rate of 182 and the longest lasting 15.3 seconds at a rate of 124.  Presently, he feels well but at times does note his heart rate increasing.  He has continued to be on metoprolol succinate 25 mg daily and continues to take lisinopril 20 mg.  He is on atorvastatin 10 mg for hyperlipidemia.  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  He underwent a repeat echo Doppler study on January 26, 2024 which showed normal LV function with EF 60 to 65%.  He had slightly increased RV systolic pressure at 35.2.  There was mild mitral valve regurgitation and evidence for mild aortic valve sclerosis without stenosis.  He had mild dilation of his ascending aorta at 42 mm with borderline dilation of aortic root at 39 mm.  He presents to the office for evaluation.   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    yrs ago excision of lower back area , localized and no recurrence per pt   History of prostate cancer    urologist--- dr [name]---  s/p  radical prostatectomy 12-21-2014 (04-03-2020 pt stated last PSA was undetactable   History of PSVT (paroxysmal supraventricular tachycardia) information obtained from epic Dr [name] office note 01-05-2010   01/ 2011  chest pain w/ palpitations,  hospital admission dx psvt,  s/p positive myoview but per cath a false positive myoview,  3wks even monitor showed no arrhythmia    (03-31-2020 per pt no issue w/ palpitations or heart racing since 2011, he feels it was anxiety)   Hyperlipidemia    Hypertension     followed by pcp   (pt had ETT/ myoview 12-06-2009 showed mild to moderate inferior ischemia then had cardiac cath 12-22-2009 showed no angiographic cad ,  false positive myoview)   OSA (obstructive sleep apnea)    03-31-2020  per pt stated last used cpap because he lost weight   PONV (postoperative nausea and vomiting)    Right hydrocele    Urinary incontinence    since s/p prostatectomy 2016   Wears glasses     Past Surgical History:  Procedure Laterality Date   HYDROCELE EXCISION N/A 04/06/2020   Procedure: RIGHT HYDROCELECTOMY ADULT AND EXCISION OF LEFT EPIDIDYMAL MASS;  Surgeon: [name], MD;  Location: [facility];  Service: Urology;  Laterality: N/A;   LYMPH NODE DISSECTION Bilateral 12/21/2014   Procedure: BILATERAL LYMPH NODE DISSECTION;  Surgeon: [name], MD;  Location: [facility];  Service: Urology;  Laterality: Bilateral;   PROSTATE BIOPSY  08/24/14   ROBOT ASSISTED LAPAROSCOPIC RADICAL PROSTATECTOMY N/A 12/21/2014   Procedure: ROBOTIC ASSISTED LAPAROSCOPIC RADICAL PROSTATECTOMY;  Surgeon: [name], MD;  Location: [facility];  Service: Urology;  Laterality: N/A;    Current Medications: Outpatient Medications Prior to Visit  Medication Sig Dispense Refill   atorvastatin (LIPITOR) 10 MG tablet Take 10 mg by mouth daily.     dorzolamide-timolol (COSOPT) 22.3-6.8 MG/ML ophthalmic solution Place 1 drop into both eyes daily.      latanoprost (XALATAN) 0.005 % ophthalmic solution Place 1 drop into both eyes at bedtime.      lisinopril (ZESTRIL) 20 MG tablet Take 1 tablet (20 mg total) by mouth daily. 30 tablet 1   metoprolol succinate (TOPROL-XL) 25 MG 24 hr tablet Take 1 tablet (25 mg total) by mouth daily. 30 tablet 1   No facility-administered medications prior to visit.     Allergies:   Patient has no known allergies.   Social History   Socioeconomic History   Marital status: Widowed    Spouse name: Not on file   Number of children: 2   Years of  education: Not on file   Highest education level: Not on file  Occupational History   Occupation: service tech  Tobacco Use   Smoking status: Former    Current packs/day: 0.00    Types: Cigarettes    Start date: 05/21/1967    Quit date: 11/18/1968    Years since quitting: 55.2   Smokeless tobacco: Former    Types: Chew    Quit date: 03/31/1969  Vaping Use   Vaping status: Never Used  Substance and Sexual Activity   Alcohol use: Yes    Alcohol/week: 0.0 standard drinks of alcohol    Comment: occasional   Drug use: Never  Sexual activity: Not on file    Comment: vasectomy  Other Topics Concern   Not on file  Social History Narrative   Not on file   Social Drivers of Health   Financial Resource Strain: Not on file  Food Insecurity: No Food Insecurity (07/02/2023)   Hunger Vital Sign    Worried About Running Out of Food in the Last Year: Never true    Ran Out of Food in the Last Year: Never true  Transportation Needs: No Transportation Needs (07/02/2023)   PRAPARE - Administrator, Civil Service (Medical): No    Lack of Transportation (Non-Medical): No  Physical Activity: Not on file  Stress: Not on file  Social Connections: Not on file    He is widowed  Family History:  The patient's family history includes Cancer in his father.   ROS General: Negative; No fevers, chills, or night sweats;  HEENT: Negative; No changes in vision or hearing, sinus congestion, difficulty swallowing Pulmonary: Negative; No cough, wheezing, shortness of breath, hemoptysis Cardiovascular: See HPI GI: Negative; No nausea, vomiting, diarrhea, or abdominal pain GU: History of prostate CA, status post robotic prostatectomy Musculoskeletal: Negative; no myalgias, joint pain, or weakness Hematologic/Oncology: Negative; no easy bruising, bleeding Endocrine: Negative; no heat/cold intolerance; no diabetes Neuro: Negative; no changes in balance, headaches Skin: Negative; No rashes or skin  lesions Psychiatric: Negative; No behavioral problems, depression Sleep: Remote history of OSA;   Other comprehensive 14 point system review is negative.   PHYSICAL EXAM:   VS:  BP 128/76   Pulse 63   Ht 6' (1.829 m)   Wt 271 lb 9.6 oz (123.2 kg)   SpO2 95%   BMI 36.84 kg/m     Repeat blood pressure by me was 144/84  Wt Readings from Last 3 Encounters:  02/11/24 271 lb 9.6 oz (123.2 kg)  07/24/23 266 lb 9.6 oz (120.9 kg)  07/01/23 272 lb 4.8 oz (123.5 kg)    General: Alert, oriented, no distress.  Skin: normal turgor, no rashes, warm and dry HEENT: Normocephalic, atraumatic. Pupils equal round and reactive to light; sclera anicteric; extraocular muscles intact; Nose without nasal septal hypertrophy Mouth/Parynx benign; Mallinpatti scale 3 Neck: No JVD, no carotid bruits; normal carotid upstroke Lungs: clear to ausculatation and percussion; no wheezing or rales Chest wall: without tenderness to palpitation Heart: PMI not displaced, RRR, s1 s2 normal, 1/6 systolic murmur, no diastolic murmur, no rubs, gallops, thrills, or heaves Abdomen: soft, nontender; no hepatosplenomehaly, BS+; abdominal aorta nontender and not dilated by palpation. Back: no CVA tenderness Pulses 2+ Musculoskeletal: full range of motion, normal strength, no joint deformities Extremities: no clubbing cyanosis or edema, Homan's sign negative  Neurologic: grossly nonfocal; Cranial nerves grossly wnl Psychologic: Normal mood and affect   Studies/Labs Reviewed:   EKG Interpretation Date/Time:  Wednesday February 11 2024 07:42:41 EDT Ventricular Rate:  63 PR Interval:  158 QRS Duration:  134 QT Interval:  410 QTC Calculation: 419 R Axis:   -71  Text Interpretation: Normal sinus rhythm Right bundle branch block Left anterior fascicular block Bifascicular block When compared with ECG of 01-Jul-2023 00:51, Premature supraventricular complexes are no longer Present Vent. rate has decreased BY  43 BPM T wave  inversion now evident in Inferior leads Nonspecific T wave abnormality now evident in Anterolateral leads QT has shortened Confirmed by Nicki Guadalajara ([id number]) on 02/13/2024 9:59:52 AM    Recent Labs:    Latest Ref Rng & Units 11/07/2023  11:24 AM 07/02/2023   12:47 AM 07/01/2023    1:12 AM  BMP  Glucose 70 - 99 mg/dL 098  119  147   BUN 8 - 27 mg/dL 24  27  38   Creatinine 0.76 - 1.27 mg/dL 8.29  5.62  1.30   BUN/Creat Ratio 10 - 24 22     Sodium 134 - 144 mmol/L 140  135  137   Potassium 3.5 - 5.2 mmol/L 4.5  3.8  3.9   Chloride 96 - 106 mmol/L 103  103  104   CO2 20 - 29 mmol/L 23  25  19    Calcium 8.6 - 10.2 mg/dL 8.9  8.6  9.0          No data to display             Latest Ref Rng & Units 07/02/2023   12:47 AM 07/01/2023    1:12 AM 04/06/2020    7:54 AM  CBC  WBC 4.0 - 10.5 K/uL 9.3  11.5    Hemoglobin 13.0 - 17.0 g/dL 86.5  78.4  69.6   Hematocrit 39.0 - 52.0 % 42.9  47.9  48.0   Platelets 150 - 400 K/uL 249  303     Lab Results  Component Value Date   MCV 93.1 07/02/2023   MCV 92.3 07/01/2023   MCV 91.2 12/08/2014   Lab Results  Component Value Date   TSH 1.935 07/01/2023   Lab Results  Component Value Date   HGBA1C 6.1 (H) 07/01/2023     BNP No results found for: "BNP"  ProBNP No results found for: "PROBNP"   Lipid Panel     Component Value Date/Time   CHOL 121 07/01/2023 0244   TRIG 101 07/01/2023 0244   HDL 30 (L) 07/01/2023 0244   CHOLHDL 4.0 07/01/2023 0244   VLDL 20 07/01/2023 0244   LDLCALC 71 07/01/2023 0244     RADIOLOGY: ECHOCARDIOGRAM LIMITED Result Date: 01/26/2024    ECHOCARDIOGRAM LIMITED REPORT   Patient Name:   Anthony Glenn Date of Exam: 01/26/2024 Medical Rec #:  [id number]       Height:       72.0 in Accession #:    [id number]      Weight:       266.6 lb Date of Birth:  04/28/1946       BSA:          2.407 m Patient Age:    77 years        BP:           128/73 mmHg Patient Gender: M               HR:           64 bpm. Exam  Location:  Outpatient Procedure: 2D Echo, Cardiac Doppler and Color Doppler (Both Spectral and Color            Flow Doppler were utilized during procedure). Indications:    Pericardial effusion [I31.39 (ICD-10-CM)]  History:        Patient has prior history of Echocardiogram examinations, most                 recent 10/24/2023. CAD; Risk Factors:Hypertension, Dyslipidemia                 and Former Smoker.  Sonographer:    Gertie Fey MHA, RDMS, RVT, RDCS Referring Phys: [id number] [facility] D WEST IMPRESSIONS  1. Left  ventricular ejection fraction, by estimation, is 60 to 65%. The left ventricle has normal function. The left ventricle has no regional wall motion abnormalities. Left ventricular diastolic function could not be evaluated.  2. Right ventricular systolic function is normal. The right ventricular size is normal. There is normal pulmonary artery systolic pressure. The estimated right ventricular systolic pressure is 35.2 mmHg.  3. The mitral valve is normal in structure. Mild mitral valve regurgitation. No evidence of mitral stenosis.  4. The aortic valve is tricuspid. Aortic valve regurgitation is trivial. Aortic valve sclerosis is present, with no evidence of aortic valve stenosis.  5. Aortic dilatation noted. There is mild dilatation of the ascending aorta, measuring 42 mm. There is borderline dilatation of the aortic root, measuring 39 mm.  6. The inferior vena cava is normal in size with greater than 50% respiratory variability, suggesting right atrial pressure of 3 mmHg. Comparison(s): No significant change from prior study. Prior images reviewed side by side. FINDINGS  Left Ventricle: Left ventricular ejection fraction, by estimation, is 60 to 65%. The left ventricle has normal function. The left ventricle has no regional wall motion abnormalities. The left ventricular internal cavity size was normal in size. There is  no left ventricular hypertrophy. Left ventricular diastolic function could not  be evaluated. Right Ventricle: The right ventricular size is normal. No increase in right ventricular wall thickness. Right ventricular systolic function is normal. There is normal pulmonary artery systolic pressure. The tricuspid regurgitant velocity is 2.25 m/s, and  with an assumed right atrial pressure of 15 mmHg, the estimated right ventricular systolic pressure is 35.2 mmHg. Left Atrium: Left atrial size was normal in size. Right Atrium: Right atrial size was normal in size. Pericardium: Trivial pericardial effusion is present. Mitral Valve: The mitral valve is normal in structure. Mild mitral valve regurgitation. No evidence of mitral valve stenosis. Tricuspid Valve: The tricuspid valve is normal in structure. Tricuspid valve regurgitation is trivial. No evidence of tricuspid stenosis. Aortic Valve: The aortic valve is tricuspid. Aortic valve regurgitation is trivial. Aortic valve sclerosis is present, with no evidence of aortic valve stenosis. Pulmonic Valve: The pulmonic valve was grossly normal. Pulmonic valve regurgitation is trivial. No evidence of pulmonic stenosis. Aorta: Aortic dilatation noted. There is mild dilatation of the ascending aorta, measuring 42 mm. There is borderline dilatation of the aortic root, measuring 39 mm. Venous: The inferior vena cava is normal in size with greater than 50% respiratory variability, suggesting right atrial pressure of 3 mmHg. IAS/Shunts: No atrial level shunt detected by color flow Doppler. Additional Comments: Spectral Doppler performed. Color Doppler performed.  LEFT VENTRICLE PLAX 2D LVIDd:         5.38 cm LVIDs:         3.30 cm LV PW:         0.98 cm LV IVS:        0.80 cm  IVC IVC diam: 3.16 cm LEFT ATRIUM           Index        RIGHT ATRIUM          Index LA diam:      3.90 cm 1.62 cm/m   RA Area:     9.40 cm LA Vol (A4C): 50.9 ml 21.14 ml/m  RA Volume:   13.40 ml 5.57 ml/m   AORTA Ao Root diam: 3.90 cm Ao Asc diam:  4.20 cm MITRAL VALVE  TRICUSPID VALVE MV Area (PHT): 3.17 cm    TR Peak grad:   20.2 mmHg MV Decel Time: 239 msec    TR Vmax:        225.00 cm/s MR Peak grad: 72.2 mmHg MR Vmax:      425.00 cm/s MV E velocity: 74.90 cm/s MV A velocity: 78.10 cm/s MV E/A ratio:  0.96 Mihai Croitoru MD Electronically signed by Thurmon Fair MD Signature Date/Time: 01/26/2024/11:18:56 AM    Final      Additional studies/ records that were reviewed today include:   The patient's August 2024 hospitalization was reviewed.  Records of [facility], NP were reviewed.  In summary of both monitors, the patient was predominantly in sinus rhythm with an average rate at 68 bpm.  Slowest heart rate was sinus bradycardia at 41.  The patient has bundle branch block/IVCD.  There were a total of 63 short bursts of SVT over the 15-days with the fastest interval lasting 12 beats with a maximum rate at 182 and the longest lasting 15.3 seconds with an average rate at 124.  There are occasional PACs and rare couplets and triplets or ventricular ectopy    ASSESSMENT:    1. Primary hypertension   2. Palpitations   3. SVT (supraventricular tachycardia) (HCC)   4. Bifascicular bundle branch block   5. History of OSA (obstructive sleep apnea)   6. Aortic root dilation (HCC)   7. Aortic valve sclerosis     PLAN:  Anthony Glenn is a 78 year old gentleman who has a history of hypertension, prior history of SVT representing atrial tachycardia or atypical AV nodal reentrant tachycardia for which she had been on beta-blocker therapy that commenced in 2011.  Cardiac catheterization at that time showed normal coronary arteries.  He had recently been hospitalized overnight after developing tachycardia in the setting of hypotension and dehydration.  He was given IV fluids with improvement.  An echo Doppler study on July 01, 2023 showed EF 60 to 65% with grade 1 diastolic dysfunction and normal pulmonary artery pressure.  A small pericardial effusion was noted.   He was felt to have mild dilation of his aortic root at 44 mm and ascending aorta at 40 mm.  He had worn a heart monitor and he was found to have short burst of SVT with the fastest interval lasting 12 beats at a rate of 182 and the longest lasting 15.3 seconds at an average rate at 124.  I reviewed his most recent echo Doppler study from January 26, 2024 which continued to show normal LV function with EF 60 to 65% and did suggest mild newly elevated RV systolic pressure at 35.2.  There was mild MR as well as mild aortic sclerosis and previously noted mild aortic dilatation at 42 mm in the ascending aorta and aortic root at 39 mm.  He has experienced occasional increased heart rates.  I reviewed his prior Holter monitor.  Presently I am recommending he increase metoprolol succinate from 25 mg daily up to 37.5 mg daily.  He will monitor his blood pressure.  If he continues to note episodes of tachycardia further titration to 50 mg daily can be undertaken.  His ECG today is stable with normal sinus rhythm at 63 and bifascicular block with right bundle branch block and left anterior hemiblock.  I discussed with him my plans for upcoming retirement.  I will transition him to the care of Dr. Epifanio Lesches and arrange for an initial evaluation in  6 to 8 months or sooner as needed.   Medication Adjustments/Labs and Tests Ordered: Current medicines are reviewed at length with the patient today.  Concerns regarding medicines are outlined above.  Medication changes, Labs and Tests ordered today are listed in the Patient Instructions below. Patient Instructions  Medication Instructions:  Take Metoprolol Succinate 37.5mg  every morning.   If you're still having increased heart rate, you may take 2 tablets (50mg ) as needed.   *If you need a refill on your cardiac medications before your next appointment, please call your pharmacy*   Lab Work: No labs were ordered during today's visit.  If you have labs  (blood work) drawn today and your tests are completely normal, you will receive your results only by: MyChart Message (if you have MyChart) OR A paper copy in the mail If you have any lab test that is abnormal or we need to change your treatment, we will call you to review the results.   Testing/Procedures: No procedures were ordered during today's visit.    Follow-Up: At [facility], you and your health needs are our priority.  As part of our continuing mission to provide you with exceptional heart care, we have created designated Provider Care Teams.  These Care Teams include your primary Cardiologist (physician) and Advanced Practice Providers (APPs -  Physician Assistants and Nurse Practitioners) who all work together to provide you with the care you need, when you need it.  We recommend signing up for the patient portal called "MyChart".  Sign up information is provided on this After Visit Summary.  MyChart is used to connect with patients for Virtual Visits (Telemedicine).  Patients are able to view lab/test results, encounter notes, upcoming appointments, etc.  Non-urgent messages can be sent to your provider as well.   To learn more about what you can do with MyChart, go to [email or web address].    Your next appointment:   8 month(s)  Provider:   Dr. Epifanio Lesches     Other Instructions [facility]  [address] [place], [place] [address] [address] [phone]       1st Floor: - Lobby - Registration  - Pharmacy  - Lab - Cafe   2nd Floor: - PV Lab - Diagnostic Testing (echo, CT, nuclear med)   3rd Floor: - Vacant   4th Floor: - TCTS (cardiothoracic surgery) - AFib Clinic - Structural Heart Clinic - Vascular Surgery  - Vascular Ultrasound   5th Floor: - HeartCare Cardiology (general and EP) - Clinical Pharmacy for coumadin, hypertension, lipid, weight-loss medications, and med management appointments       Valet parking services will be available as well.           Signed, Nicki Guadalajara, MD  02/13/2024 10:20 AM    [facility] [address], [place], [place]  [address] Phone: [phone]

## 2024-02-23 DIAGNOSIS — H2513 Age-related nuclear cataract, bilateral: Secondary | ICD-10-CM | POA: Diagnosis not present

## 2024-02-23 DIAGNOSIS — H401131 Primary open-angle glaucoma, bilateral, mild stage: Secondary | ICD-10-CM | POA: Diagnosis not present

## 2024-02-23 DIAGNOSIS — H53143 Visual discomfort, bilateral: Secondary | ICD-10-CM | POA: Diagnosis not present

## 2024-02-25 ENCOUNTER — Telehealth: Payer: Self-pay | Admitting: Cardiovascular Disease

## 2024-02-25 ENCOUNTER — Other Ambulatory Visit: Payer: Self-pay | Admitting: Urology

## 2024-02-25 NOTE — Telephone Encounter (Signed)
 Dr. Tresa Endo,  Anthony Glenn 78 year old male is requesting preoperative cardiac evaluation for insertion of male sling.  Procedure is scheduled for 03/09/2024.  He was recently seen by you in clinic on 02/11/2024.  He was stable from a cardiac standpoint.  His metoprolol was increased to 37.5 mg in the morning.  Please advise on cardiac risk for his upcoming procedure.  Thank you for your help.  Please direct your response to CV DIV preop pool.  Thomasene Ripple. Armenta Erskin NP-C     02/25/2024, 1:40 PM [facility] [address] Office [phone] Fax [phone]   .

## 2024-02-25 NOTE — Telephone Encounter (Signed)
   Pre-operative Risk Assessment    Patient Name: Anthony Glenn  DOB: [birth year] MRN: [id number]    Date of last office visit: 02/11/24 Date of next office visit: n/a   Request for Surgical Clearance    Procedure:   insertion of male sling   Date of Surgery:  Clearance 03/09/24                                Surgeon:  Dr, Blaine Hamper Group or Practice Name:  [facility]  Phone number:  [phone] Fax number:  [phone]   Type of Clearance Requested:   - Medical    Type of Anesthesia:  General    Additional requests/questions:    SignedFilomena Jungling   02/25/2024, 1:23 PM

## 2024-03-01 NOTE — Patient Instructions (Signed)
SURGICAL WAITING ROOM VISITATION  Patients having surgery or a procedure may have no more than 2 support people in the waiting area - these visitors may rotate.    Children under the age of 37 must have an adult with them who is not the patient.  Due to an increase in RSV and influenza rates and associated hospitalizations, children ages 67 and under may not visit patients in [facility] hospitals.  Visitors with respiratory illnesses are discouraged from visiting and should remain at home.  If the patient needs to stay at the hospital during part of their recovery, the visitor guidelines for inpatient rooms apply. Pre-op nurse will coordinate an appropriate time for 1 support person to accompany patient in pre-op.  This support person may not rotate.    Please refer to the [facility] website for the visitor guidelines for Inpatients (after your surgery is over and you are in a regular room).    Your procedure is scheduled on: 03/09/24   Report to [facility] Main Entrance    Report to admitting at 10:30 AM   Call this number if you have problems the morning of surgery [phone]   Do not eat food or drink liquids :After Midnight.          If you have questions, please contact your surgeon's office.   FOLLOW BOWEL PREP AND ANY ADDITIONAL PRE OP INSTRUCTIONS YOU RECEIVED FROM YOUR SURGEON'S OFFICE!!!     Oral Hygiene is also important to reduce your risk of infection.                                    Remember - BRUSH YOUR TEETH THE MORNING OF SURGERY WITH YOUR REGULAR TOOTHPASTE  DENTURES WILL BE REMOVED PRIOR TO SURGERY PLEASE DO NOT APPLY "Poly grip" OR ADHESIVES!!!   Stop all vitamins and herbal supplements 7 days before surgery.   Take these medicines the morning of surgery with A SIP OF WATER: Atorvastatin, Metoprolol                               You may not have any metal on your body including jewelry, and body piercing             Do not wear lotions,  powders, cologne, or deodorant              Men may shave face and neck.   Do not bring valuables to the hospital. [place] IS NOT             RESPONSIBLE   FOR VALUABLES.   Contacts, glasses, dentures or bridgework may not be worn into surgery.  DO NOT BRING YOUR HOME MEDICATIONS TO THE HOSPITAL. PHARMACY WILL DISPENSE MEDICATIONS LISTED ON YOUR MEDICATION LIST TO YOU DURING YOUR ADMISSION IN THE HOSPITAL!    Patients discharged on the day of surgery will not be allowed to drive home.  Someone NEEDS to stay with you for the first 24 hours after anesthesia.   Special Instructions: Bring a copy of your healthcare power of attorney and living will documents the day of surgery if you haven't scanned them before.              Please read over the following fact sheets you were given: IF YOU HAVE QUESTIONS ABOUT YOUR PRE-OP INSTRUCTIONS PLEASE CALL [phone]-  Kayleen Party    If you received a COVID test during your pre-op visit  it is requested that you wear a mask when out in public, stay away from anyone that may not be feeling well and notify your surgeon if you develop symptoms. If you test positive for Covid or have been in contact with anyone that has tested positive in the last 10 days please notify you surgeon.    [facility] - Preparing for Surgery Before surgery, you can play an important role.  Because skin is not sterile, your skin needs to be as free of germs as possible.  You can reduce the number of germs on your skin by washing with CHG (chlorahexidine gluconate) soap before surgery.  CHG is an antiseptic cleaner which kills germs and bonds with the skin to continue killing germs even after washing. Please DO NOT use if you have an allergy to CHG or antibacterial soaps.  If your skin becomes reddened/irritated stop using the CHG and inform your nurse when you arrive at Short Stay. Do not shave (including legs and underarms) for at least 48 hours prior to the first CHG shower.  You  may shave your face/neck.  Please follow these instructions carefully:  1.  Shower with CHG Soap the night before surgery and the  morning of surgery.  2.  If you choose to wash your hair, wash your hair first as usual with your normal  shampoo.  3.  After you shampoo, rinse your hair and body thoroughly to remove the shampoo.                             4.  Use CHG as you would any other liquid soap.  You can apply chg directly to the skin and wash.  Gently with a scrungie or clean washcloth.  5.  Apply the CHG Soap to your body ONLY FROM THE NECK DOWN.   Do   not use on face/ open                           Wound or open sores. Avoid contact with eyes, ears mouth and   genitals (private parts).                       Wash face,  Genitals (private parts) with your normal soap.             6.  Wash thoroughly, paying special attention to the area where your    surgery  will be performed.  7.  Thoroughly rinse your body with warm water from the neck down.  8.  DO NOT shower/wash with your normal soap after using and rinsing off the CHG Soap.                9.  Pat yourself dry with a clean towel.            10.  Wear clean pajamas.            11.  Place clean sheets on your bed the night of your first shower and do not  sleep with pets. Day of Surgery : Do not apply any lotions/deodorants the morning of surgery.  Please wear clean clothes to the hospital/surgery center.  FAILURE TO FOLLOW THESE INSTRUCTIONS MAY RESULT IN THE CANCELLATION OF YOUR SURGERY  PATIENT SIGNATURE_________________________________  NURSE SIGNATURE__________________________________  ________________________________________________________________________ 

## 2024-03-01 NOTE — Progress Notes (Signed)
 COVID Vaccine Completed: yes  Date of COVID positive in last 90 days:  PCP - Thermon Flattery ,MD Cardiologist - Magnus Schuller, MD LOV 02/11/24  Chest x-ray - 07/01/23 Epic EKG - 02/11/24 Epic Stress Test -  ECHO - 01/26/24 Epic Cardiac Cath - 2011 Pacemaker/ICD device last checked: Spinal Cord Stimulator:  Bowel Prep -   Sleep Study -  CPAP -   Fasting Blood Sugar - preDM? Checks Blood Sugar _____ times a day  Last dose of GLP1 agonist-  N/A GLP1 instructions:  Hold 7 days before surgery    Last dose of SGLT-2 inhibitors-  N/A SGLT-2 instructions:  Hold 3 days before surgery    Blood Thinner Instructions:  Last dose:   Time: Aspirin Instructions: Last Dose:  Activity level:  Can go up a flight of stairs and perform activities of daily living without stopping and without symptoms of chest pain or shortness of breath.  Able to exercise without symptoms  Unable to go up a flight of stairs without symptoms of     Anesthesia review: HTN, PVST, CAD, needs cardiac clearance   Patient denies shortness of breath, fever, cough and chest pain at PAT appointment  Patient verbalized understanding of instructions that were given to them at the PAT appointment. Patient was also instructed that they will need to review over the PAT instructions again at home before surgery.

## 2024-03-02 ENCOUNTER — Encounter (HOSPITAL_COMMUNITY)
Admission: RE | Admit: 2024-03-02 | Discharge: 2024-03-02 | Disposition: A | Discharge status: Home / Self Care | Source: Ambulatory Visit | Attending: Urology | Admitting: Urology

## 2024-03-02 ENCOUNTER — Encounter (HOSPITAL_COMMUNITY): Payer: Self-pay

## 2024-03-02 ENCOUNTER — Other Ambulatory Visit: Payer: Self-pay

## 2024-03-02 VITALS — BP 133/80 | HR 84 | Temp 98.2°F | Resp 16 | Ht 72.0 in | Wt 245.0 lb

## 2024-03-02 DIAGNOSIS — N393 Stress incontinence (female) (male): Secondary | ICD-10-CM | POA: Insufficient documentation

## 2024-03-02 DIAGNOSIS — I358 Other nonrheumatic aortic valve disorders: Secondary | ICD-10-CM | POA: Insufficient documentation

## 2024-03-02 DIAGNOSIS — Z01812 Encounter for preprocedural laboratory examination: Secondary | ICD-10-CM | POA: Diagnosis not present

## 2024-03-02 DIAGNOSIS — I340 Nonrheumatic mitral (valve) insufficiency: Secondary | ICD-10-CM | POA: Diagnosis not present

## 2024-03-02 DIAGNOSIS — I2510 Atherosclerotic heart disease of native coronary artery without angina pectoris: Secondary | ICD-10-CM | POA: Diagnosis not present

## 2024-03-02 DIAGNOSIS — Z79899 Other long term (current) drug therapy: Secondary | ICD-10-CM | POA: Diagnosis not present

## 2024-03-02 DIAGNOSIS — I712 Thoracic aortic aneurysm, without rupture, unspecified: Secondary | ICD-10-CM | POA: Insufficient documentation

## 2024-03-02 DIAGNOSIS — G4733 Obstructive sleep apnea (adult) (pediatric): Secondary | ICD-10-CM | POA: Insufficient documentation

## 2024-03-02 DIAGNOSIS — Z87891 Personal history of nicotine dependence: Secondary | ICD-10-CM | POA: Insufficient documentation

## 2024-03-02 DIAGNOSIS — I10 Essential (primary) hypertension: Secondary | ICD-10-CM | POA: Diagnosis not present

## 2024-03-02 DIAGNOSIS — Z8546 Personal history of malignant neoplasm of prostate: Secondary | ICD-10-CM | POA: Insufficient documentation

## 2024-03-02 DIAGNOSIS — I471 Supraventricular tachycardia, unspecified: Secondary | ICD-10-CM | POA: Diagnosis not present

## 2024-03-02 LAB — BASIC METABOLIC PANEL WITH GFR
Anion gap: 8 (ref 5–15)
BUN: 24 mg/dL — ABNORMAL HIGH (ref 8–23)
CO2: 26 mmol/L (ref 22–32)
Calcium: 9.4 mg/dL (ref 8.9–10.3)
Chloride: 106 mmol/L (ref 98–111)
Creatinine, Ser: 1.02 mg/dL (ref 0.61–1.24)
GFR, Estimated: >60 mL/min (ref >60)
Glucose, Bld: 111 mg/dL — ABNORMAL HIGH (ref 70–99)
Potassium: 4.4 mmol/L (ref 3.5–5.1)
Sodium: 140 mmol/L (ref 135–145)

## 2024-03-02 LAB — CBC
HCT: 49.5 % (ref 39.0–52.0)
Hemoglobin: 15.8 g/dL (ref 13.0–17.0)
MCH: 30.3 pg (ref 26.0–34.0)
MCHC: 31.9 g/dL (ref 30.0–36.0)
MCV: 94.8 fL (ref 80.0–100.0)
Platelets: 259 10*3/uL (ref 150–400)
RBC: 5.22 MIL/uL (ref 4.22–5.81)
RDW: 12.8 % (ref 11.5–15.5)
WBC: 10 10*3/uL (ref 4.0–10.5)
nRBC: 0 % (ref 0.0–0.2)

## 2024-03-03 LAB — URINE CULTURE: Culture: NO GROWTH

## 2024-03-04 NOTE — Telephone Encounter (Signed)
 Patient is given clearance for upcoming surgical procedure

## 2024-03-04 NOTE — Telephone Encounter (Signed)
   Patient Name: Anthony Glenn  DOB: December 04, 1945 MRN: [id number]  Primary Cardiologist: Magnus Schuller, MD  Chart reviewed as part of pre-operative protocol coverage. Given past medical history and time since last visit, based on ACC/AHA guidelines, Anthony Glenn is at acceptable risk for the planned procedure without further cardiovascular testing.   The patient was advised that if he develops new symptoms prior to surgery to contact our office to arrange for a follow-up visit, and he verbalized understanding.  I will route this recommendation to the requesting party via Epic fax function and remove from pre-op pool.  Please call with questions.  Francene Ing, Retha Cast, NP 03/04/2024, 12:52 PM

## 2024-03-04 NOTE — Progress Notes (Addendum)
 Anesthesia Chart Review   Case: [id number] Date/Time: 03/09/24 0930   Procedure: CREATION, URETHRAL SLING, MALE   Anesthesia type: General   Diagnosis: Stress incontinence, male [N39.3]   Pre-op diagnosis: STRESS URINARY INCONTINENCE   [facility] ROOM 03 / [facility]   Surgeons: Mallie Seal, MD       DISCUSSION:77 y.o. 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 Josefine Nice.   Pt last seen by cardiology 02/11/2024. He has episodes of palpitations which is overall controlled with Metoprolol .  He underwent a repeat echo Doppler study on January 26, 2024 which showed normal LV function with EF 60 to 65%.  He had slightly increased RV systolic pressure at 35.2.  There was mild mitral valve regurgitation and evidence for mild aortic valve sclerosis without stenosis.  He had mild dilation of his ascending aorta at 42 mm with borderline dilation of aortic root at 39 mm. His Toprol  dose was increased. He was cleared for surgery:  "Chart reviewed as part of pre-operative protocol coverage. Given past medical history and time since last visit, based on ACC/AHA guidelines, Anthony Glenn is at acceptable risk for the planned procedure without further cardiovascular testing."    VS: BP 133/80   Pulse 84   Temp 36.8 C (Oral)   Resp 16   Ht 6' (1.829 m)   Wt 111.1 kg   SpO2 95%   BMI 33.23 kg/m   PROVIDERS: Job Mulch, MD is PCP   Cardiologist - Magnus Schuller, MD  LABS: Labs reviewed: Acceptable for surgery. (all labs ordered are listed, but only abnormal results are displayed)  Labs Reviewed  BASIC METABOLIC PANEL WITH GFR - Abnormal; Notable for the following components:      Result Value   Glucose, Bld 111 (*)    BUN 24 (*)    All other components within normal limits  URINE CULTURE  CBC     IMAGES: CTA Chest  11/18/23:  IMPRESSION: Greatest estimated diameter of the ascending aorta 4.2 cm. Recommend annual imaging followup by CTA or MRA. This recommendation follows 2010 ACCF/AHA/AATS/ACR/ASA/SCA/SCAI/SIR/STS/SVM Guidelines for the Diagnosis and Management of Patients with Thoracic Aortic Disease. Circulation. 2010; 121: A540-J811. Aortic aneurysm NOS (ICD10-I71.9)   Coronary artery disease and aortic atherosclerosis. Aortic Atherosclerosis (ICD10-I70.0).   Cystic nodule of the left thyroid  estimated 3.9 cm. Further evaluation with dedicated thyroid  ultrasound is recommended.   Liver steatosis.  EKG 02/11/24:  Normal sinus rhythm Right bundle branch block Left anterior fascicular block Bifascicular block When compared with ECG of 01-Jul-2023 00:51, Premature supraventricular complexes are no longer Present Vent. rate has decreased BY 43 BPM T wave inversion now evident in Inferior leads Nonspecific T wave abnormality now evident in Anterolateral leads QT has shortened  CV: Echo 01/26/2024:  1. Left ventricular ejection fraction, by estimation, is 60 to 65%. The  left ventricle has normal function. The left ventricle has no regional  wall motion abnormalities. Left ventricular diastolic function could not  be evaluated.   2. Right ventricular systolic function is normal. The right ventricular  size is normal. There is normal pulmonary artery systolic pressure. The  estimated right ventricular systolic pressure is 35.2 mmHg.   3. The mitral valve is normal in structure. Mild mitral valve  regurgitation. No evidence of mitral stenosis.   4. The aortic valve is tricuspid. Aortic valve regurgitation is trivial.  Aortic valve sclerosis is present, with no evidence of aortic valve  stenosis.   5. Aortic dilatation noted. There is mild dilatation of the ascending  aorta, measuring 42 mm. There is borderline dilatation of the aortic root,  measuring 39 mm.   6. The inferior vena cava  is normal in size with greater than 50%  respiratory variability, suggesting right atrial pressure of 3 mmHg.   Heart monitor 07/28/23:  In summary, the patient was predominantly in sinus rhythm with an average rate at 68 bpm.  Slowest heart rate was sinus bradycardia at 41.  The patient has bundle branch block/IVCD.  There were a total of 63 short bursts of SVT over the 15-days with the fastest interval lasting 12 beats with a maximum rate at 182 and the longest lasting 15.3 seconds with an average rate at 124.  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    yrs ago excision of lower back area , localized and no recurrence per pt   History of prostate cancer    urologist--- dr Inga Manges---  s/p  radical prostatectomy 12-21-2014 (04-03-2020 pt stated last PSA was undetactable   History of PSVT (paroxysmal supraventricular tachycardia) information obtained from epic Dr Mitzie Anda office note 01-05-2010   01/ 2011  chest pain w/ palpitations,  hospital admission dx psvt,  s/p positive myoview but per cath a false positive myoview,  3wks even monitor showed no arrhythmia    (03-31-2020 per pt no issue w/ palpitations or heart racing since 2011, he feels it was anxiety)   Hyperlipidemia    Hypertension    followed by pcp   (pt had ETT/ myoview 12-06-2009 showed mild to moderate inferior ischemia then had cardiac cath 12-22-2009 showed no angiographic cad ,  false positive myoview)   OSA (obstructive sleep apnea)    03-31-2020  per pt stated last used cpap because he lost weight   PONV (postoperative nausea and vomiting)    Right hydrocele    Urinary incontinence    since s/p prostatectomy 2016   Wears glasses     Past  Surgical History:  Procedure Laterality Date   HYDROCELE EXCISION N/A 04/06/2020   Procedure: RIGHT HYDROCELECTOMY ADULT AND EXCISION OF LEFT EPIDIDYMAL MASS;  Surgeon: Homero Luster, MD;  Location: [facility];  Service: Urology;  Laterality: N/A;   LYMPH NODE DISSECTION Bilateral 12/21/2014   Procedure: BILATERAL LYMPH NODE DISSECTION;  Surgeon: Willye Harvey, MD;  Location: [facility];  Service: Urology;  Laterality: Bilateral;   PROSTATE BIOPSY  08/24/14   ROBOT ASSISTED LAPAROSCOPIC RADICAL PROSTATECTOMY N/A 12/21/2014   Procedure: ROBOTIC ASSISTED LAPAROSCOPIC RADICAL PROSTATECTOMY;  Surgeon: Willye Harvey, MD;  Location: [facility];  Service: Urology;  Laterality: N/A;    MEDICATIONS:  atorvastatin  (LIPITOR) 10 MG tablet   dorzolamide-timolol  (COSOPT) 22.3-6.8 MG/ML ophthalmic solution   latanoprost  (XALATAN ) 0.005 % ophthalmic solution   lisinopril  (ZESTRIL ) 20 MG tablet   metoprolol  succinate (TOPROL -XL) 25 MG 24 hr tablet   Multiple Vitamin (MULTIVITAMIN) tablet   No current facility-administered medications for this encounter.   Kimble Pennant, PA-C [facility] Short Stay/Anesthesiology [facility] Phone (  [phone] 03/08/2024 10:09 AM

## 2024-03-08 NOTE — Anesthesia Preprocedure Evaluation (Addendum)
 Anesthesia Evaluation  Patient identified by MRN, date of birth, ID band Patient awake    Reviewed: Allergy & Precautions, NPO status , Patient's Chart, lab work & pertinent test results, reviewed documented beta blocker date and time   History of Anesthesia Complications (+) PONV and history of anesthetic complications  Airway Mallampati: III  TM Distance: >3 FB Neck ROM: Full    Dental  (+) Teeth Intact, Dental Advisory Given, Caps   Pulmonary sleep apnea , former smoker   Pulmonary exam normal breath sounds clear to auscultation       Cardiovascular hypertension, Pt. on home beta blockers and Pt. 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  Reproductive/Obstetrics                             Anesthesia Physical Anesthesia Plan  ASA: 3  Anesthesia Plan: General   Post-op Pain Management: Tylenol  PO (pre-op)*   Induction: Intravenous  PONV Risk Score and Plan: 4 or greater and Dexamethasone  and Ondansetron   Airway Management Planned: LMA  Additional Equipment:   Intra-op Plan:   Post-operative Plan: Extubation in OR  Informed Consent: I have reviewed the patients History and Physical, chart, labs and discussed the procedure including the risks, benefits and alternatives for the proposed anesthesia with the  patient or authorized representative who has indicated his/her understanding and acceptance.     Dental advisory given  Plan Discussed with: CRNA  Anesthesia Plan Comments: (See PAT note from 4/15)        Anesthesia Quick Evaluation

## 2024-03-09 ENCOUNTER — Ambulatory Visit (HOSPITAL_COMMUNITY): Admitting: Physician Assistant

## 2024-03-09 ENCOUNTER — Ambulatory Visit (HOSPITAL_BASED_OUTPATIENT_CLINIC_OR_DEPARTMENT_OTHER): Admitting: Anesthesiology

## 2024-03-09 ENCOUNTER — Other Ambulatory Visit (HOSPITAL_COMMUNITY): Payer: Self-pay

## 2024-03-09 ENCOUNTER — Ambulatory Visit (HOSPITAL_COMMUNITY)
Admission: RE | Admit: 2024-03-09 | Discharge: 2024-03-09 | Disposition: A | Discharge status: Home / Self Care | Source: Ambulatory Visit | Attending: Urology | Admitting: Urology

## 2024-03-09 ENCOUNTER — Encounter (HOSPITAL_COMMUNITY): Payer: Self-pay | Admitting: Urology

## 2024-03-09 ENCOUNTER — Other Ambulatory Visit: Payer: Self-pay

## 2024-03-09 ENCOUNTER — Encounter (HOSPITAL_COMMUNITY)
Admission: RE | Disposition: A | Discharge status: Home / Self Care | Payer: Self-pay | Source: Ambulatory Visit | Attending: Urology

## 2024-03-09 DIAGNOSIS — E669 Obesity, unspecified: Secondary | ICD-10-CM | POA: Insufficient documentation

## 2024-03-09 DIAGNOSIS — G4730 Sleep apnea, unspecified: Secondary | ICD-10-CM | POA: Diagnosis not present

## 2024-03-09 DIAGNOSIS — N393 Stress incontinence (female) (male): Secondary | ICD-10-CM | POA: Diagnosis present

## 2024-03-09 DIAGNOSIS — I739 Peripheral vascular disease, unspecified: Secondary | ICD-10-CM | POA: Diagnosis not present

## 2024-03-09 DIAGNOSIS — G4733 Obstructive sleep apnea (adult) (pediatric): Secondary | ICD-10-CM | POA: Diagnosis not present

## 2024-03-09 DIAGNOSIS — Z79899 Other long term (current) drug therapy: Secondary | ICD-10-CM | POA: Diagnosis not present

## 2024-03-09 DIAGNOSIS — Z8546 Personal history of malignant neoplasm of prostate: Secondary | ICD-10-CM | POA: Diagnosis not present

## 2024-03-09 DIAGNOSIS — Z9079 Acquired absence of other genital organ(s): Secondary | ICD-10-CM | POA: Insufficient documentation

## 2024-03-09 DIAGNOSIS — I10 Essential (primary) hypertension: Secondary | ICD-10-CM

## 2024-03-09 DIAGNOSIS — Z6833 Body mass index (BMI) 33.0-33.9, adult: Secondary | ICD-10-CM | POA: Diagnosis not present

## 2024-03-09 DIAGNOSIS — I2510 Atherosclerotic heart disease of native coronary artery without angina pectoris: Secondary | ICD-10-CM

## 2024-03-09 DIAGNOSIS — Z87891 Personal history of nicotine dependence: Secondary | ICD-10-CM | POA: Diagnosis not present

## 2024-03-09 HISTORY — PX: URETHRAL SLING: SHX2621

## 2024-03-09 SURGERY — CREATION, URETHRAL SLING, MALE
Anesthesia: General

## 2024-03-09 MED ORDER — LIDOCAINE HCL 1 % IJ SOLN
INTRAMUSCULAR | Status: AC
  Filled 2024-03-09: qty 20

## 2024-03-09 MED ORDER — SULFAMETHOXAZOLE-TRIMETHOPRIM 800-160 MG PO TABS
1.0000 | ORAL_TABLET | Freq: Two times a day (BID) | ORAL | 0 refills | Status: AC
Start: 2024-03-09 — End: ?
  Filled 2024-03-09: qty 14, 7d supply, fill #0

## 2024-03-09 MED ORDER — OXYCODONE HCL 5 MG PO TABS
5.0000 mg | ORAL_TABLET | Freq: Four times a day (QID) | ORAL | 0 refills | Status: AC | PRN
  Filled 2024-03-09: qty 16, 4d supply, fill #0

## 2024-03-09 MED ORDER — EPHEDRINE SULFATE-NACL 50-0.9 MG/10ML-% IV SOSY
PREFILLED_SYRINGE | INTRAVENOUS | Status: DC | PRN
  Administered 2024-03-09: 5 mg via INTRAVENOUS

## 2024-03-09 MED ORDER — LIDOCAINE 2% (20 MG/ML) 5 ML SYRINGE
INTRAMUSCULAR | Status: DC | PRN
  Administered 2024-03-09: 100 mg via INTRAVENOUS

## 2024-03-09 MED ORDER — CHLORHEXIDINE GLUCONATE 4 % EX SOLN: Freq: Once | CUTANEOUS | Status: DC

## 2024-03-09 MED ORDER — FENTANYL CITRATE (PF) 100 MCG/2ML IJ SOLN
INTRAMUSCULAR | Status: DC | PRN
  Administered 2024-03-09: 50 ug via INTRAVENOUS
  Administered 2024-03-09 (×2): 25 ug via INTRAVENOUS

## 2024-03-09 MED ORDER — BUPIVACAINE HCL (PF) 0.5 % IJ SOLN
INTRAMUSCULAR | Status: AC
Start: 2024-03-09 — End: ?
  Filled 2024-03-09: qty 30

## 2024-03-09 MED ORDER — 0.9 % SODIUM CHLORIDE (POUR BTL) OPTIME
TOPICAL | Status: DC | PRN
  Administered 2024-03-09: 1000 mL

## 2024-03-09 MED ORDER — ONDANSETRON HCL 4 MG/2ML IJ SOLN: 4.0000 mg | Freq: Once | INTRAMUSCULAR | Status: DC | PRN

## 2024-03-09 MED ORDER — ONDANSETRON HCL 4 MG/2ML IJ SOLN
INTRAMUSCULAR | Status: AC
  Filled 2024-03-09: qty 2

## 2024-03-09 MED ORDER — LACTATED RINGERS IV SOLN: INTRAVENOUS | Status: DC | PRN

## 2024-03-09 MED ORDER — LIDOCAINE HCL (PF) 2 % IJ SOLN
INTRAMUSCULAR | Status: AC
  Filled 2024-03-09: qty 5

## 2024-03-09 MED ORDER — PROPOFOL 10 MG/ML IV BOLUS
INTRAVENOUS | Status: DC | PRN
  Administered 2024-03-09: 140 mg via INTRAVENOUS

## 2024-03-09 MED ORDER — EPHEDRINE 5 MG/ML INJ
INTRAVENOUS | Status: AC
  Filled 2024-03-09: qty 5

## 2024-03-09 MED ORDER — CELECOXIB 200 MG PO CAPS
200.0000 mg | ORAL_CAPSULE | Freq: Two times a day (BID) | ORAL | 1 refills | Status: AC
  Filled 2024-03-09: qty 28, 14d supply, fill #0

## 2024-03-09 MED ORDER — PHENYLEPHRINE 80 MCG/ML (10ML) SYRINGE FOR IV PUSH (FOR BLOOD PRESSURE SUPPORT)
PREFILLED_SYRINGE | INTRAVENOUS | Status: DC | PRN
  Administered 2024-03-09 (×2): 240 ug via INTRAVENOUS
  Administered 2024-03-09 (×2): 160 ug via INTRAVENOUS
  Administered 2024-03-09: 240 ug via INTRAVENOUS

## 2024-03-09 MED ORDER — LACTATED RINGERS IV SOLN: INTRAVENOUS | Status: DC

## 2024-03-09 MED ORDER — FENTANYL CITRATE PF 50 MCG/ML IJ SOSY
25.0000 ug | PREFILLED_SYRINGE | INTRAMUSCULAR | Status: DC | PRN
  Administered 2024-03-09: 50 ug via INTRAVENOUS

## 2024-03-09 MED ORDER — ACETAMINOPHEN 500 MG PO TABS
1000.0000 mg | ORAL_TABLET | Freq: Once | ORAL | Status: AC
  Administered 2024-03-09: 1000 mg via ORAL
  Filled 2024-03-09: qty 2

## 2024-03-09 MED ORDER — CEFAZOLIN SODIUM-DEXTROSE 2-4 GM/100ML-% IV SOLN
2.0000 g | INTRAVENOUS | Status: AC
  Administered 2024-03-09: 2 g via INTRAVENOUS
  Filled 2024-03-09: qty 100

## 2024-03-09 MED ORDER — CHLORHEXIDINE GLUCONATE 0.12 % MT SOLN
15.0000 mL | Freq: Once | OROMUCOSAL | Status: AC
  Administered 2024-03-09: 15 mL via OROMUCOSAL

## 2024-03-09 MED ORDER — ORAL CARE MOUTH RINSE: 15.0000 mL | Freq: Once | OROMUCOSAL | Status: AC

## 2024-03-09 MED ORDER — FENTANYL CITRATE PF 50 MCG/ML IJ SOSY
PREFILLED_SYRINGE | INTRAMUSCULAR | Status: AC
  Filled 2024-03-09: qty 1

## 2024-03-09 MED ORDER — BUPIVACAINE HCL (PF) 0.5 % IJ SOLN
INTRAMUSCULAR | Status: DC | PRN
  Administered 2024-03-09: 4 mL

## 2024-03-09 MED ORDER — STERILE WATER FOR IRRIGATION IR SOLN
Status: DC | PRN
  Administered 2024-03-09: 500 mL

## 2024-03-09 MED ORDER — DEXAMETHASONE SODIUM PHOSPHATE 10 MG/ML IJ SOLN
INTRAMUSCULAR | Status: DC | PRN
  Administered 2024-03-09: 8 mg via INTRAVENOUS

## 2024-03-09 MED ORDER — DEXAMETHASONE SODIUM PHOSPHATE 10 MG/ML IJ SOLN
INTRAMUSCULAR | Status: AC
  Filled 2024-03-09: qty 1

## 2024-03-09 MED ORDER — ACETAMINOPHEN 500 MG PO TABS
1000.0000 mg | ORAL_TABLET | Freq: Four times a day (QID) | ORAL | 0 refills | Status: AC
  Filled 2024-03-09: qty 50, 7d supply, fill #0

## 2024-03-09 MED ORDER — PHENYLEPHRINE HCL-NACL 20-0.9 MG/250ML-% IV SOLN
INTRAVENOUS | Status: DC | PRN
  Administered 2024-03-09: 50 ug/min via INTRAVENOUS

## 2024-03-09 MED ORDER — ONDANSETRON HCL 4 MG/2ML IJ SOLN
INTRAMUSCULAR | Status: DC | PRN
Start: 2024-03-09 — End: 2024-03-09
  Administered 2024-03-09: 4 mg via INTRAVENOUS

## 2024-03-09 MED ORDER — LIDOCAINE HCL (PF) 1 % IJ SOLN
INTRAMUSCULAR | Status: DC | PRN
  Administered 2024-03-09: 4 mL

## 2024-03-09 MED ORDER — MUPIROCIN 2 % EX OINT
1.0000 | TOPICAL_OINTMENT | Freq: Once | CUTANEOUS | Status: AC
Start: 2024-03-09 — End: 2024-03-09
  Administered 2024-03-09: 1 via NASAL
  Filled 2024-03-09: qty 22

## 2024-03-09 MED ORDER — PROPOFOL 10 MG/ML IV BOLUS
INTRAVENOUS | Status: AC
  Filled 2024-03-09: qty 20

## 2024-03-09 MED ORDER — FENTANYL CITRATE (PF) 100 MCG/2ML IJ SOLN
INTRAMUSCULAR | Status: AC
  Filled 2024-03-09: qty 2

## 2024-03-09 MED ORDER — DEXTROSE 5 % IV SOLN
440.0000 mg | INTRAVENOUS | Status: AC
  Administered 2024-03-09: 440 mg via INTRAVENOUS
  Filled 2024-03-09: qty 11

## 2024-03-09 SURGICAL SUPPLY — 47 items
BAG COUNTER SPONGE SURGICOUNT (BAG) IMPLANT
BAG URINE DRAIN 2000ML AR STRL (UROLOGICAL SUPPLIES) ×1 IMPLANT
BLADE SURG 15 STRL LF DISP TIS (BLADE) ×1 IMPLANT
BNDG GAUZE DERMACEA FLUFF 4 (GAUZE/BANDAGES/DRESSINGS) ×1 IMPLANT
BRIEF MESH DISP LRG (UNDERPADS AND DIAPERS) ×1 IMPLANT
CATH FOLEY 2WAY SLVR 5CC 14FR (CATHETERS) ×1 IMPLANT
CATH TIEMANN FOLEY 18FR 5CC (CATHETERS) ×1 IMPLANT
CHLORAPREP W/TINT 26 (MISCELLANEOUS) ×2 IMPLANT
COVER BACK TABLE 60X90IN (DRAPES) ×1 IMPLANT
COVER MAYO STAND STRL (DRAPES) ×1 IMPLANT
COVER SURGICAL LIGHT HANDLE (MISCELLANEOUS) ×1 IMPLANT
DERMABOND ADVANCED .7 DNX12 (GAUZE/BANDAGES/DRESSINGS) ×2 IMPLANT
DRAPE UNDERBUTTOCKS STRL (DISPOSABLE) ×1 IMPLANT
DRSG TELFA 3X8 NADH STRL (GAUZE/BANDAGES/DRESSINGS) ×1 IMPLANT
ELECT PENCIL ROCKER SW 15FT (MISCELLANEOUS) ×1 IMPLANT
GAUZE 4X4 16PLY ~~LOC~~+RFID DBL (SPONGE) ×2 IMPLANT
GLOVE BIOGEL M 7.0 STRL (GLOVE) ×1 IMPLANT
GLOVE BIOGEL PI IND STRL 7.0 (GLOVE) ×1 IMPLANT
GOWN STRL REUS W/ TWL XL LVL3 (GOWN DISPOSABLE) ×1 IMPLANT
HOLDER FOLEY CATH W/STRAP (MISCELLANEOUS) ×1 IMPLANT
KIT BASIN OR (CUSTOM PROCEDURE TRAY) ×1 IMPLANT
KIT TURNOVER KIT A (KITS) IMPLANT
LUBRICANT JELLY K Y 4OZ (MISCELLANEOUS) ×1 IMPLANT
NDL HYPO 22X1.5 SAFETY MO (MISCELLANEOUS) ×1 IMPLANT
NDL SPNL 22GX3.5 QUINCKE BK (NEEDLE) ×1 IMPLANT
NEEDLE HYPO 22X1.5 SAFETY MO (MISCELLANEOUS) ×1 IMPLANT
NEEDLE SPNL 22GX3.5 QUINCKE BK (NEEDLE) ×1 IMPLANT
PLUG CATH AND CAP STRL 200 (CATHETERS) ×1 IMPLANT
PROTECTOR NERVE ULNAR (MISCELLANEOUS) ×1 IMPLANT
RETRACTOR DEEP SCROTAL PENILE (MISCELLANEOUS) IMPLANT
SET IRRIG Y TYPE TUR BLADDER L (SET/KITS/TRAYS/PACK) ×1 IMPLANT
SHEET LAVH (DRAPES) ×1 IMPLANT
SLING ADVANCE MALE SYSTEM (Mesh General) IMPLANT
SPIKE FLUID TRANSFER (MISCELLANEOUS) IMPLANT
STAPLER SKIN PROX WIDE 3.9 (STAPLE) ×1 IMPLANT
SUT MNCRL AB 4-0 PS2 18 (SUTURE) ×1 IMPLANT
SUT SILK 2 0 30 PSL (SUTURE) IMPLANT
SUT VIC AB 2-0 SH 27X BRD (SUTURE) IMPLANT
SUT VIC AB 3-0 SH 27XBRD (SUTURE) ×4 IMPLANT
SUT VIC AB 4-0 PS2 27 (SUTURE) IMPLANT
SUT VIC AB 4-0 RB1 27XBRD (SUTURE) IMPLANT
SYR 10ML LL (SYRINGE) ×1 IMPLANT
SYR 20ML LL LF (SYRINGE) ×1 IMPLANT
SYR BULB IRRIG 60ML STRL (SYRINGE) ×1 IMPLANT
TOWEL OR 17X26 10 PK STRL BLUE (TOWEL DISPOSABLE) ×2 IMPLANT
TUBING CONNECTING 10 (TUBING) ×1 IMPLANT
YANKAUER SUCT BULB TIP 10FT TU (MISCELLANEOUS) ×1 IMPLANT

## 2024-03-09 NOTE — Op Note (Signed)
 Preoperative diagnosis:  1. Stress urinary incontinence 2. History of prostate cancer s/p prostatectomy  Postoperative diagnosis: same  Procedure(s): 1. Insertion of male sling (advance XP) 2. Flexible cystoscopy  Surgeon: Dr. Julene Oaks  Anesthesia: General  Complications: none  EBL: 50 cc  Intraoperative findings: Good bulbar urethra displacement and urethral coaptation after placement and tightening of sling  Indication: Anthony Glenn is a 78 y.o.yo M who with stress incontinence s/p prostatectomy. After an extensive discussion, he is interested in proceeding with a sling  Description of procedure:  After appropriate consent had been obtained, the patient was brought to the operative suite where anesthesia was induced. The patient was prepped and draped in the usual sterile fashion. Extra care was taken with leg positioning to minimize the risk of compartment syndrome neuropathy and deep vein thrombosis.  Preoperative antibiotics were given.  I an made approximately 5 cm incision in the perineum involving the lower aspect of the scrotum.  I dissected down through soft tissue and mobilized the subcutaneous tissue from the bulbospongiosus midline. The lonestar retractor was placed and used throughout for exposure. I then split the muscle in the midline with my usual technique. Using a curved cerebellar retractor I exposed nicely the bulbar urethra.  It was easy to see and feel the perineal tendon that was sharply taken down with Metzenbaum scissors.  There was 2 to 4 cm of mobility of the bulbar urethra towards the patient's head.  I was very pleased with identification and mobilization. I placed a 3-0 Vicryl where the tendon was initially attached to the bulb  I was very diligent at the beginning of the case and throughout the case to feel the abductor tendon relative to the obturator foramen.  I marked the area of entrance of the foramen needle with a marking pen.  I used 22 French  foramen needle to find the inferior rami and then through the foramen itself below the tendon.  I kept the angle appropriate with the patient in Trendelenburg.  I made a scalpel incision on the patient's left side approximately 1 cm in length.  With appropriate exposure I placed my finger in the upper aspect of the triangle on the patient's left side.  With the described technique I passed a trocar initially placing it against the buttock at 45 degree angle.  With my thumb I did a double pop maneuver through the soft tissues layers and then dropping the handle and delivering the tip onto the pulp of my index finger on the patient's left side.  The foramen needle was easily passed and with correct orientation the mesh was attached.  I double checked the location of the needle and was excellent.  I gently rotated the trocar with the mesh sling coming back through the skin  I did the exact same procedure on the right.  The mesh was attached in correct orientation.  Gently pulling on both slings and with my finger between the bulb and the mesh I gently brought the mesh close to the bulbar urethra.  The 3-0 Vicryl that was initially placed through the sponge was brought out through the distal aspect of the mesh.  I then gently synched the mesh down to the bulb.  I placed two 3-0 Vicryl's in the proximal aspect of the mesh through the bulbospongiosus.  I then passed another in the midline.  I was very happy with the orientation of the mesh.  I then gently tensioned the sling over the 18  Fr foley until it "clam-shelled." I then inserted the cystoscope to examine. Ultimately I tensioned the sling just a little more and I was very happy with the coaptation of the urethra and stopped at that point. A 14 fr catheter was then inserted.  Visually and palpably there was excellent rotation of the bulbar urethra.   The sling was cut below each blue dot.  It had been rolled by my assistant prior to help for its release.   I irrigated.  I placed a hemostat deeply across the silicone and white sheath removing them easily.  This was done on both sides.  The mesh was cut below the skin level in the inguinal creases.  I closed each inguinal incision with 2 interrupted 4-0 Vicryl followed by Dermabond.  A 4 layer closure was used for the perineum.  I was cognizant of the scrotal aspect of the closure with 3-0 Vicryl that also included reapproximation of the bulbospongiosus muscle.  The next layer was deeper subcutaneous suture with 3-0 Vicryl.  A third layer was close to the skin with level with 3-0 Vicryl.  4-0 running mattress sutures used for the perineum.  Dermabond was applied with fluff dressing  The Foley catheter was draining well at the end of the case.  Leg position was good. I was very pleased with the surgery and hopefully it reaches the patient's treatment goal  Julene Oaks MD 03/09/2024, 12:20 PM  [facility]  Pager: [phone]

## 2024-03-09 NOTE — H&P (Signed)
 H&P  History of Present Illness: Anthony Glenn is a 78 y.o. 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    yrs ago excision of lower back area , localized and no recurrence per pt   History of prostate cancer    urologist--- dr Inga Manges---  s/p  radical prostatectomy 12-21-2014 (04-03-2020 pt stated last PSA was undetactable   History of PSVT (paroxysmal supraventricular tachycardia) information obtained from epic Dr Mitzie Anda office note 01-05-2010   01/ 2011  chest pain w/ palpitations,  hospital admission dx psvt,  s/p positive myoview but per cath a false positive myoview,  3wks even monitor showed no arrhythmia    (03-31-2020 per pt no issue w/ palpitations or heart racing since 2011, he feels it was anxiety)   Hyperlipidemia    Hypertension    followed by pcp   (pt had ETT/ myoview 12-06-2009 showed mild to moderate inferior ischemia then had cardiac cath 12-22-2009 showed no angiographic cad ,  false positive myoview)   OSA (obstructive sleep apnea)    03-31-2020  per pt stated last used cpap because he lost weight   PONV (postoperative nausea and vomiting)    Right hydrocele    Urinary incontinence    since s/p prostatectomy 2016   Wears glasses     Past Surgical History:  Procedure Laterality Date   HYDROCELE EXCISION N/A 04/06/2020   Procedure: RIGHT HYDROCELECTOMY ADULT AND EXCISION OF LEFT EPIDIDYMAL MASS;  Surgeon: Homero Luster, MD;  Location: [facility];  Service: Urology;  Laterality: N/A;   LYMPH NODE DISSECTION Bilateral 12/21/2014   Procedure: BILATERAL LYMPH NODE DISSECTION;  Surgeon: Willye Harvey, MD;  Location: [facility];  Service: Urology;  Laterality: Bilateral;   PROSTATE BIOPSY   08/24/14   ROBOT ASSISTED LAPAROSCOPIC RADICAL PROSTATECTOMY N/A 12/21/2014   Procedure: ROBOTIC ASSISTED LAPAROSCOPIC RADICAL PROSTATECTOMY;  Surgeon: Willye Harvey, MD;  Location: [facility];  Service: Urology;  Laterality: N/A;    Home Medications:  Current Meds  Medication Sig   atorvastatin  (LIPITOR) 10 MG tablet Take 10 mg by mouth daily.   dorzolamide-timolol  (COSOPT) 22.3-6.8 MG/ML ophthalmic solution Place 1 drop into both eyes daily.    latanoprost  (XALATAN ) 0.005 % ophthalmic solution Place 1 drop into both eyes at bedtime.    lisinopril  (ZESTRIL ) 20 MG tablet Take 1 tablet (20 mg total) by mouth daily.   metoprolol  succinate (TOPROL -XL) 25 MG 24 hr tablet Take 1.5 tablets (37.5 mg total) by mouth every morning. If still having increased heart rate, take 50 mg as needed   Multiple Vitamin (MULTIVITAMIN) tablet Take 1 tablet by mouth daily. Amino 150    Allergies: No Known Allergies  Family History  Problem Relation Age of Onset   Cancer Father        laryngeal    Social History:  reports that he quit smoking about 55 years ago. His smoking use included cigarettes. He started smoking about 56 years ago. He has never been exposed to tobacco smoke. He quit smokeless tobacco use about 54 years ago.  His smokeless tobacco use included chew. He reports that he does not  currently use alcohol. He reports that he does not use drugs.  ROS: A complete review of systems was performed.  All systems are negative except for pertinent findings as noted.  Physical Exam:  Vital signs in last 24 hours: Temp:  [98.2 F (36.8 C)] 98.2 F (36.8 C) (04/22 0713) Pulse Rate:  [81] 81 (04/22 0713) Resp:  [15] 15 (04/22 0713) BP: (142)/(94) 142/94 (04/22 0713) SpO2:  [94 %] 94 % (04/22 0713) Weight:  [111.1 kg] 111.1 kg (04/22 0747) Constitutional:  Alert and oriented, No acute distress Cardiovascular: Regular rate and rhythm Respiratory: Normal respiratory effort, Lungs clear bilaterally GI:  Abdomen is soft, nontender, nondistended, no abdominal masses Lymphatic: No lymphadenopathy Neurologic: Grossly intact, no focal deficits Psychiatric: Normal mood and affect   Laboratory Data:  No results for input(s): "WBC", "HGB", "HCT", "PLT" in the last 72 hours.  No results for input(s): "NA", "K", "CL", "GLUCOSE", "BUN", "CALCIUM ", "CREATININE" in the last 72 hours.  Invalid input(s): "CO3"   No results found for this or any previous visit (from the past 24 hours). Recent Results (from the past 240 hours)  Urine Culture     Status: None   Collection Time: 03/02/24  7:38 AM   Specimen: Urine, Clean Catch  Result Value Ref Range Status   Specimen Description   Final    URINE, CLEAN CATCH Performed at [facility], [address] [place], [place] [address]    Special Requests   Final    NONE Performed at [facility], [address], [place], [place] [address]    Culture   Final    NO GROWTH Performed at [facility], [address] [place], [place] [address]    Report Status 03/03/2024 FINAL  Final    Renal Function: No results for input(s): "CREATININE" in the last 168 hours. Estimated Creatinine Clearance: 75.6 mL/min (by C-G formula based on SCr of 1.02 mg/dL).  Radiologic Imaging: No results found.  Assessment:  Anthony Glenn is a 78 y.o. 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 Procedure and risks reviewed, including but not limited to bleeding, infection, implant infection, implant malfunction, implant malplacement, erosion, damage to adjacent structures, pain, urinary retention, failure to improve or make dry. All questions answered   Julene Oaks, MD 03/09/2024, 9:57 AM  [facility] Pager: [phone]

## 2024-03-09 NOTE — Anesthesia Postprocedure Evaluation (Signed)
 Anesthesia Post Note  Patient: Anthony Glenn  Procedure(s) Performed: CREATION, Arliss Benton, MALE     Patient location during evaluation: PACU Anesthesia Type: General Level of consciousness: awake and alert Pain management: pain level controlled Vital Signs Assessment: post-procedure vital signs reviewed and stable Respiratory status: spontaneous breathing, nonlabored ventilation and respiratory function stable Cardiovascular status: blood pressure returned to baseline and stable Postop Assessment: no apparent nausea or vomiting Anesthetic complications: no   No notable events documented.  Last Vitals:  Vitals:   03/09/24 1245 03/09/24 1300  BP: 129/74 127/85  Pulse: 69 68  Resp: 16 14  Temp:    SpO2: 90% 93%    Last Pain:  Vitals:   03/09/24 1300  TempSrc:   PainSc: 4                  Erin Havers

## 2024-03-09 NOTE — Anesthesia Procedure Notes (Signed)
 Procedure Name: LMA Insertion Date/Time: 03/09/2024 10:36 AM  Performed by: Josetta Niece, CRNAPre-anesthesia Checklist: Patient identified, Emergency Drugs available, Suction available and Patient being monitored Patient Re-evaluated:Patient Re-evaluated prior to induction Oxygen Delivery Method: Circle System Utilized Preoxygenation: Pre-oxygenation with 100% oxygen Induction Type: IV induction Ventilation: Mask ventilation without difficulty LMA: LMA inserted LMA Size: 4.0 Number of attempts: 1 Placement Confirmation: positive ETCO2 Tube secured with: Tape Dental Injury: Teeth and Oropharynx as per pre-operative assessment

## 2024-03-09 NOTE — Discharge Instructions (Addendum)
Male Sling Post Operative Instructions Dr. Luke Machen  You may notice some swelling or black and blue bruising. This is very common, and may increase slightly over the next several days. Typically it will begin to improve 1-2 weeks after surgery You may take a shower 48 hours after surgery. Avoid submerging yourself completely in water (bath tubs, hot tubs, swimming pools, etc) until 1 month after the surgery. To clean the incision, let soapy water gently wash over the area and pat lightly. Use supportive, tight-fitting underwear for the first two weeks after surgery. For example, jock straps, sliding shorts, or briefs Apply ice packs 20 minutes on/20 minutes off for at least the first 2 days following surgery. This will help  minimize swelling and discomfort. After the first few days you can continue using ice packs if they are helpful  The skin incision is closed with sutures and purple skin glue. Both of these things dissolve on their own over the course of several weeks.   Avoid lifting anything heavier than 15 pounds for 1 month Do not put any direct pressure on the incision for long periods of time for the first 6 weeks following surgery. For example, do not ride a bicycle, motorcycle, ATV, or horse.  Sitting on a soft cushion, "donut" cushion, or pillow can help with the discomfort Avoid all sexual contact for 2 weeks following the surgery. You will be prescribed an antibiotic following the surgery; please take this as prescribed.  For pain post-operatively, you will typically be prescribed 2 medicines. The first is an anti-inflammatory medication (Celebrex, Toradol, Meloxicam). The second is narcotic pain medication (Tramadol, Oxycodone). You can take these as needed, and can supplement with over the counter tylenol. I recommend limiting the amount of narcotic pain medication you take as these medications can cause constipation and in rare cases, addiction.   

## 2024-03-09 NOTE — Transfer of Care (Signed)
 Immediate Anesthesia Transfer of Care Note  Patient: Anthony Glenn  Procedure(s) Performed: CREATION, Arliss Benton, MALE  Patient Location: PACU  Anesthesia Type:General  Level of Consciousness: awake  Airway & Oxygen Therapy: Patient Spontanous Breathing and Patient connected to face mask oxygen  Post-op Assessment: Report given to RN and Post -op Vital signs reviewed and stable  Post vital signs: Reviewed and stable  Last Vitals:  Vitals Value Taken Time  BP 123/80 03/09/24 1227  Temp    Pulse 75 03/09/24 1230  Resp 15 03/09/24 1230  SpO2 97 % 03/09/24 1230  Vitals shown include unfiled device data.  Last Pain:  Vitals:   03/09/24 0755  TempSrc:   PainSc: 0-No pain      Patients Stated Pain Goal: 5 (03/09/24 0747)  Complications: No notable events documented.

## 2024-03-10 ENCOUNTER — Encounter (HOSPITAL_COMMUNITY): Payer: Self-pay | Admitting: Urology

## 2024-04-17 DIAGNOSIS — E7800 Pure hypercholesterolemia, unspecified: Secondary | ICD-10-CM | POA: Diagnosis not present

## 2024-04-17 DIAGNOSIS — I2510 Atherosclerotic heart disease of native coronary artery without angina pectoris: Secondary | ICD-10-CM | POA: Diagnosis not present

## 2024-05-17 DIAGNOSIS — I10 Essential (primary) hypertension: Secondary | ICD-10-CM | POA: Diagnosis not present

## 2024-05-17 DIAGNOSIS — I2510 Atherosclerotic heart disease of native coronary artery without angina pectoris: Secondary | ICD-10-CM | POA: Diagnosis not present

## 2024-05-17 DIAGNOSIS — G4700 Insomnia, unspecified: Secondary | ICD-10-CM | POA: Diagnosis not present

## 2024-05-17 DIAGNOSIS — E7800 Pure hypercholesterolemia, unspecified: Secondary | ICD-10-CM | POA: Diagnosis not present

## 2024-05-17 DIAGNOSIS — R7303 Prediabetes: Secondary | ICD-10-CM | POA: Diagnosis not present

## 2024-06-09 ENCOUNTER — Other Ambulatory Visit (HOSPITAL_COMMUNITY): Payer: Self-pay

## 2024-06-17 DIAGNOSIS — I2510 Atherosclerotic heart disease of native coronary artery without angina pectoris: Secondary | ICD-10-CM | POA: Diagnosis not present

## 2024-06-17 DIAGNOSIS — E7800 Pure hypercholesterolemia, unspecified: Secondary | ICD-10-CM | POA: Diagnosis not present

## 2024-07-18 DIAGNOSIS — E7800 Pure hypercholesterolemia, unspecified: Secondary | ICD-10-CM | POA: Diagnosis not present

## 2024-07-18 DIAGNOSIS — I2510 Atherosclerotic heart disease of native coronary artery without angina pectoris: Secondary | ICD-10-CM | POA: Diagnosis not present

## 2024-08-17 DIAGNOSIS — E7800 Pure hypercholesterolemia, unspecified: Secondary | ICD-10-CM | POA: Diagnosis not present

## 2024-08-17 DIAGNOSIS — I2510 Atherosclerotic heart disease of native coronary artery without angina pectoris: Secondary | ICD-10-CM | POA: Diagnosis not present
